# Patient Record
Sex: Female | Born: 1955 | Race: White | Hispanic: No | Smoking: Former smoker
Health system: Southern US, Community
[De-identification: ages and names within clinical notes are randomized; demographics above are authoritative.]

## PROBLEM LIST (undated history)

## (undated) DIAGNOSIS — T7840XA Allergy, unspecified, initial encounter: Secondary | ICD-10-CM

## (undated) DIAGNOSIS — F32A Depression, unspecified: Secondary | ICD-10-CM

## (undated) DIAGNOSIS — F329 Major depressive disorder, single episode, unspecified: Secondary | ICD-10-CM

## (undated) HISTORY — DX: Allergy, unspecified, initial encounter: T78.40XA

## (undated) HISTORY — DX: Depression, unspecified: F32.A

## (undated) HISTORY — PX: BREAST SURGERY: SHX581

## (undated) HISTORY — DX: Major depressive disorder, single episode, unspecified: F32.9

## (undated) HISTORY — PX: KNEE SURGERY: SHX244

## (undated) HISTORY — PX: BREAST EXCISIONAL BIOPSY: SUR124

## (undated) HISTORY — PX: COSMETIC SURGERY: SHX468

## (undated) NOTE — ED Provider Notes (Signed)
 Formatting of this note is different from the original. HISTORY   HISTORY LIMITATION  None  ARRIVAL MODE  Walk in clinic   CHIEF COMPLAINT  Otalgia (left)  HISTORY OF PRESENT ILLNESS  Patient is a 32 year old female with PMH of migraines presents to urgent care with complaints of left ear pain for the past week.  She has also had slight nasal congestion.  Otherwise denies other symptoms including cough, sore throat, fever/chills.  No ear drainage.  Has tried Tylenol  without improvement.  No history of frequent ear infections.  HEALTH STATUS   MEDICAL HISTORY  Problem List: There are no relevant problems documented for this patient.   SURGICAL HISTORY  History reviewed. No pertinent surgical history.  FAMILY HISTORY  Relevant family history has been mentioned in the HPI or was otherwise deemed not pertinent to today's reason for visit.  MEDICATIONS   Current Outpatient Medications  Medication Instructions   amoxicillin-clavulanate (AUGMENTIN) 875-125 mg per tablet 875 mg, Oral, 2 times daily   ALLERGIES  Patient has no known allergies.  SOCIAL HISTORY   Social History   Tobacco Use   Smoking status: Never   Smokeless tobacco: Never  Vaping Use   Vaping status: Never Used  Substance Use Topics   Alcohol use: Yes    Alcohol/week: 2.0 standard drinks of alcohol    Types: 2 Glasses of wine per week   Drug use: Never   EXAMINATION   VITAL SIGNS  Patient Vitals for the past 24 hrs:  BP Temp Temp src Pulse Resp SpO2  04/15/23 1707 (!) 140/99 98 F (36.7 C) Oral 89 18 100 %   PHYSICAL EXAM  Physical Exam Vitals and nursing note reviewed.  Constitutional:      General: She is not in acute distress.    Appearance: Normal appearance. She is not ill-appearing.  HENT:     Head: Normocephalic and atraumatic.     Right Ear: Tympanic membrane, ear canal and external ear normal.     Left Ear: Ear canal and external ear normal.     Ears:     Comments: Left ear with  suppurative effusion    Nose: Congestion present.     Mouth/Throat:     Mouth: Mucous membranes are moist.     Pharynx: Oropharynx is clear. No oropharyngeal exudate or posterior oropharyngeal erythema.  Eyes:     General:        Right eye: No discharge.        Left eye: No discharge.     Conjunctiva/sclera: Conjunctivae normal.  Cardiovascular:     Rate and Rhythm: Normal rate.  Pulmonary:     Effort: Pulmonary effort is normal.  Musculoskeletal:        General: Normal range of motion.     Cervical back: Normal range of motion.  Skin:    General: Skin is warm and dry.  Neurological:     General: No focal deficit present.     Mental Status: She is alert and oriented to person, place, and time.   DIAGNOSTICS AND PROCEDURES   LABS  No results found for this or any previous visit (from the past 24 hour(s)).  IMAGING   No orders to display   EKG AND PROCEDURES  Procedures  ADMINISTERED MEDICATIONS  Medications - No data to display  MEDICAL DECISION MAKING AND ED COURSE   MEDICAL DECISION MAKING  Patient is a 59 year old female who presents to urgent care with complaints of 1 week  of left-sided ear pain and nasal congestion.  On exam patient is alert, no acute distress, hemodynamically stable, afebrile.  She does have a left-sided otitis media.  Will start on Augmentin.  Advised other symptom cares, return precautions.  Patient understands and agrees with the plan.  She is discharged in stable condition.  EMERGENCY DEPARTMENT TIMELINE   Clinical Impressions as of 04/15/23 1755  Left otitis media, unspecified otitis media type     SOCIAL DETERMINATES OF HEALTH  The following factors affected the patient's Emergency Department care. None.  DIAGNOSTIC IMPRESSION   1. Left otitis media, unspecified otitis media type    DISPOSITION   DISCHARGED   NEW DISCHARGE MEDICATIONS   Discharge Medication List as of 04/15/2023  5:46 PM    START taking these medications    Details  amoxicillin-clavulanate (AUGMENTIN) 875-125 mg per tablet Take 1 tablet (875 mg total) by mouth 2 (two) times daily for 7 days., Starting Fri 04/15/2023, Until Fri 04/22/2023, Normal     AFTERCARE INSTRUCTIONS  No follow-up provider specified.  CELESTINO Willia Leek, PA-C  [facility] Office: [phone]     Fax: [phone]    In the interest of transparency, the 21st Century Cures Act requires healthcare organizations to make nearly all medical information readily available to patients.  Please remember that this document is intended as a form of ?peer-to-peer? communication.  Often medical records contain verbiage and abbreviations that might be unfamiliar and without context.  Language may appear direct as it is intended to succinctly relay the clinical opinion of the practitioner.    Cutts, Arianna, PA-C 04/15/23 1755   Chrystie Donnice RAMAN, MD 04/16/23 [phone]  Electronically signed by Chrystie Donnice RAMAN, MD at 04/16/2023  4:46 PM CST  Associated attestation - Chrystie Donnice RAMAN, MD - 04/16/2023  4:46 PM CST Formatting of this note might be different from the original. Definitive care was exclusively provided by the Advanced Practice Provider.

## (undated) NOTE — ED Triage Notes (Signed)
 Formatting of this note might be different from the original. Pt c/o left ear pain for approximately one week. Electronically signed by Laverna Skates, RN at 04/15/2023  5:17 PM CST

---

## 1997-06-18 ENCOUNTER — Ambulatory Visit (HOSPITAL_COMMUNITY): Admission: RE | Admit: 1997-06-18 | Discharge: 1997-06-18 | Payer: Self-pay | Admitting: *Deleted

## 1997-10-15 ENCOUNTER — Other Ambulatory Visit: Admission: RE | Admit: 1997-10-15 | Discharge: 1997-10-15 | Payer: Self-pay | Admitting: Obstetrics and Gynecology

## 1998-05-09 ENCOUNTER — Inpatient Hospital Stay (HOSPITAL_COMMUNITY): Admission: AD | Admit: 1998-05-09 | Discharge: 1998-05-09 | Payer: Self-pay | Admitting: Obstetrics and Gynecology

## 1998-05-10 ENCOUNTER — Inpatient Hospital Stay (HOSPITAL_COMMUNITY): Admission: AD | Admit: 1998-05-10 | Discharge: 1998-05-12 | Payer: Self-pay | Admitting: Obstetrics & Gynecology

## 1999-07-13 ENCOUNTER — Encounter: Payer: Self-pay | Admitting: Obstetrics and Gynecology

## 1999-07-13 ENCOUNTER — Encounter: Admission: RE | Admit: 1999-07-13 | Discharge: 1999-07-13 | Payer: Self-pay | Admitting: Obstetrics and Gynecology

## 1999-10-12 ENCOUNTER — Other Ambulatory Visit: Admission: RE | Admit: 1999-10-12 | Discharge: 1999-10-12 | Payer: Self-pay | Admitting: Obstetrics and Gynecology

## 2000-05-04 ENCOUNTER — Encounter: Payer: Self-pay | Admitting: Obstetrics and Gynecology

## 2000-05-04 ENCOUNTER — Encounter: Admission: RE | Admit: 2000-05-04 | Discharge: 2000-05-04 | Payer: Self-pay | Admitting: Obstetrics and Gynecology

## 2001-05-10 ENCOUNTER — Encounter: Payer: Self-pay | Admitting: Obstetrics and Gynecology

## 2001-05-10 ENCOUNTER — Encounter: Admission: RE | Admit: 2001-05-10 | Discharge: 2001-05-10 | Payer: Self-pay | Admitting: Obstetrics and Gynecology

## 2001-07-10 ENCOUNTER — Encounter: Payer: Self-pay | Admitting: Obstetrics and Gynecology

## 2001-07-10 ENCOUNTER — Encounter: Admission: RE | Admit: 2001-07-10 | Discharge: 2001-07-10 | Payer: Self-pay | Admitting: Obstetrics and Gynecology

## 2001-11-29 ENCOUNTER — Encounter: Admission: RE | Admit: 2001-11-29 | Discharge: 2001-11-29 | Payer: Self-pay | Admitting: Obstetrics and Gynecology

## 2001-11-29 ENCOUNTER — Encounter: Payer: Self-pay | Admitting: Obstetrics and Gynecology

## 2001-12-06 ENCOUNTER — Encounter (INDEPENDENT_AMBULATORY_CARE_PROVIDER_SITE_OTHER): Payer: Self-pay | Admitting: Specialist

## 2001-12-06 ENCOUNTER — Encounter: Payer: Self-pay | Admitting: Obstetrics and Gynecology

## 2001-12-06 ENCOUNTER — Encounter: Admission: RE | Admit: 2001-12-06 | Discharge: 2001-12-06 | Payer: Self-pay | Admitting: Obstetrics and Gynecology

## 2003-08-09 ENCOUNTER — Other Ambulatory Visit: Admission: RE | Admit: 2003-08-09 | Discharge: 2003-08-09 | Payer: Self-pay | Admitting: Obstetrics and Gynecology

## 2003-08-16 ENCOUNTER — Encounter: Admission: RE | Admit: 2003-08-16 | Discharge: 2003-08-16 | Payer: Self-pay | Admitting: Obstetrics and Gynecology

## 2005-01-26 ENCOUNTER — Encounter: Admission: RE | Admit: 2005-01-26 | Discharge: 2005-01-26 | Payer: Self-pay | Admitting: Obstetrics and Gynecology

## 2007-01-18 ENCOUNTER — Encounter: Admission: RE | Admit: 2007-01-18 | Discharge: 2007-01-18 | Payer: Self-pay | Admitting: Obstetrics and Gynecology

## 2008-08-23 ENCOUNTER — Encounter: Admission: RE | Admit: 2008-08-23 | Discharge: 2008-08-23 | Payer: Self-pay | Admitting: Obstetrics and Gynecology

## 2010-03-16 ENCOUNTER — Emergency Department (HOSPITAL_COMMUNITY)
Admission: EM | Admit: 2010-03-16 | Discharge: 2010-03-16 | Payer: Self-pay | Source: Home / Self Care | Admitting: Emergency Medicine

## 2010-03-23 ENCOUNTER — Observation Stay (HOSPITAL_COMMUNITY)
Admission: EM | Admit: 2010-03-23 | Discharge: 2010-03-24 | Payer: Self-pay | Source: Home / Self Care | Attending: Family Medicine | Admitting: Family Medicine

## 2010-03-23 DIAGNOSIS — F19939 Other psychoactive substance use, unspecified with withdrawal, unspecified: Secondary | ICD-10-CM

## 2010-03-23 DIAGNOSIS — F10239 Alcohol dependence with withdrawal, unspecified: Secondary | ICD-10-CM

## 2010-03-23 DIAGNOSIS — F19239 Other psychoactive substance dependence with withdrawal, unspecified: Secondary | ICD-10-CM

## 2010-03-23 DIAGNOSIS — F329 Major depressive disorder, single episode, unspecified: Secondary | ICD-10-CM

## 2010-03-30 LAB — COMPREHENSIVE METABOLIC PANEL
ALT: 13 U/L (ref 0–35)
AST: 17 U/L (ref 0–37)
Albumin: 4.4 g/dL (ref 3.5–5.2)
Alkaline Phosphatase: 42 U/L (ref 39–117)
BUN: 6 mg/dL (ref 6–23)
CO2: 21 mEq/L (ref 19–32)
Calcium: 9.4 mg/dL (ref 8.4–10.5)
Chloride: 103 mEq/L (ref 96–112)
Creatinine, Ser: 0.79 mg/dL (ref 0.4–1.2)
GFR calc Af Amer: >60 mL/min (ref >60)
GFR calc non Af Amer: >60 mL/min (ref >60)
Glucose, Bld: 133 mg/dL — ABNORMAL HIGH (ref 70–99)
Potassium: 2.7 mEq/L — CL (ref 3.5–5.1)
Sodium: 139 mEq/L (ref 135–145)
Total Bilirubin: 2.1 mg/dL — ABNORMAL HIGH (ref 0.3–1.2)
Total Protein: 6.9 g/dL (ref 6.0–8.3)

## 2010-03-30 LAB — CBC
HCT: 36.4 % (ref 36.0–46.0)
HCT: 32.9 % — ABNORMAL LOW (ref 36.0–46.0)
Hemoglobin: 12.1 g/dL (ref 12.0–15.0)
Hemoglobin: 10.7 g/dL — ABNORMAL LOW (ref 12.0–15.0)
MCH: 30.4 pg (ref 26.0–34.0)
MCH: 30.4 pg (ref 26.0–34.0)
MCHC: 33.2 g/dL (ref 30.0–36.0)
MCHC: 32.5 g/dL (ref 30.0–36.0)
MCV: 91.5 fL (ref 78.0–100.0)
MCV: 93.5 fL (ref 78.0–100.0)
Platelets: 238 10*3/uL (ref 150–400)
Platelets: 202 10*3/uL (ref 150–400)
RBC: 3.98 MIL/uL (ref 3.87–5.11)
RBC: 3.52 MIL/uL — ABNORMAL LOW (ref 3.87–5.11)
RDW: 13.5 % (ref 11.5–15.5)
RDW: 14.1 % (ref 11.5–15.5)
WBC: 4.7 10*3/uL (ref 4.0–10.5)
WBC: 3.5 10*3/uL — ABNORMAL LOW (ref 4.0–10.5)

## 2010-03-30 LAB — BASIC METABOLIC PANEL
BUN: 7 mg/dL (ref 6–23)
CO2: 25 mEq/L (ref 19–32)
Calcium: 8.5 mg/dL (ref 8.4–10.5)
Chloride: 108 mEq/L (ref 96–112)
Creatinine, Ser: 0.66 mg/dL (ref 0.4–1.2)
GFR calc Af Amer: >60 mL/min (ref >60)
GFR calc non Af Amer: >60 mL/min (ref >60)
Glucose, Bld: 92 mg/dL (ref 70–99)
Potassium: 3.7 mEq/L (ref 3.5–5.1)
Sodium: 141 mEq/L (ref 135–145)

## 2010-03-30 LAB — URINE MICROSCOPIC-ADD ON

## 2010-03-30 LAB — URINALYSIS, ROUTINE W REFLEX MICROSCOPIC
Bilirubin Urine: NEGATIVE
Hgb urine dipstick: NEGATIVE
Ketones, ur: >80 mg/dL — AB
Nitrite: NEGATIVE
Protein, ur: NEGATIVE mg/dL
Specific Gravity, Urine: 1.009 (ref 1.005–1.030)
Urine Glucose, Fasting: NEGATIVE mg/dL
Urobilinogen, UA: 0.2 mg/dL (ref 0.0–1.0)
pH: 6 (ref 5.0–8.0)

## 2010-03-30 LAB — URINE CULTURE
Colony Count: NO GROWTH
Culture  Setup Time: 201201091520
Culture: NO GROWTH

## 2010-03-30 LAB — DIFFERENTIAL
Basophils Absolute: 0 10*3/uL (ref 0.0–0.1)
Basophils Relative: 0 % (ref 0–1)
Eosinophils Absolute: 0 10*3/uL (ref 0.0–0.7)
Eosinophils Relative: 1 % (ref 0–5)
Lymphocytes Relative: 17 % (ref 12–46)
Lymphs Abs: 0.8 10*3/uL (ref 0.7–4.0)
Monocytes Absolute: 0.4 10*3/uL (ref 0.1–1.0)
Monocytes Relative: 8 % (ref 3–12)
Neutro Abs: 3.5 10*3/uL (ref 1.7–7.7)
Neutrophils Relative %: 74 % (ref 43–77)

## 2010-03-30 LAB — LIPASE, BLOOD: Lipase: 19 U/L (ref 11–59)

## 2010-04-03 NOTE — H&P (Signed)
NAME:  [name], [name]                  ACCOUNT NO.:  [id number]  MEDICAL RECORD NO.:  [id number]          PATIENT TYPE:  INP  LOCATION:  1825                         FACILITY:  [facility]  PHYSICIAN:  Pearlean Brownie, M.D.DATE OF BIRTH:  Feb 04, 1956  DATE OF ADMISSION:  03/23/2010 DATE OF DISCHARGE:                             HISTORY & PHYSICAL   PRIMARY CARE PHYSICIAN:  At [facility].  CHIEF COMPLAINT:  Nausea, vomiting, diarrhea.  HISTORY OF PRESENT ILLNESS:  [name] is a 55 year old female with a 2-week history of nausea, vomiting, diarrhea.  She states that she was first exposed to her son who had a viral illness x2 weeks ago at which time she developed the nausea, vomiting, and diarrhea.  She was seen at her primary care physician's office and prescribed Phenergan as well as Imodium.  She states that she started feeling little bit better and then started feeling worse again, so she went back to her doctor and was diagnosed with urinary tract infection.  At that time, she was prescribed Cipro.  She is back today complaining of more nausea, vomiting, diarrhea associated with headaches, dizziness, fatigue, body aches, and some slight tremors.  She denies fevers, chills, upper respiratory infection signs or symptoms, chest pain, shortness of breath, lower extremity edema and rash.  Upon further discussion, [name] also endorses taking usual amount of 0.5 to 2 mg of Xanax daily as well as drinking two glasses to one bottle of wine each night. She states that because of her illness she has not been able to take much by mouth and has not taking her benzodiazepine or alcohol in over 48 hours.  PAST MEDICAL HISTORY:  Includes depression, anxiety, insomnia, and alcohol abuse.  MEDICATIONS:  Include Zoloft 100 mg p.o. daily, Ambien 10 mg p.o. at bedtime, Xanax 0.5 to 1 mg p.o. daily p.r.n. anxiety, Mobic 50 mg p.o. q.8 h. p.r.n. pain though she admits that she has  not been taking this medication regularly.  ALLERGIES:  No known drug allergies.  PAST SURGICAL HISTORY:  She endorses previous septal surgery as well as bilateral laser knee surgeries.  FAMILY HISTORY:  Family history with no GI history.  REVIEW OF SYSTEMS:  Please see HPI.  PHYSICAL EXAMINATION:  VITALS:  Within normal limits with the exception of slightly elevated blood pressure of 140-160 systolic. GENERAL:  She is alert and oriented.  She does appear uncomfortable and is lying on her right side with the light off. HEENT: Normocephalic, atraumatic.  Pupils equally round and reactive to light.  Extraocular muscles are intact.  Mucous membranes are dry.  TMs are within normal limits. NECK:  No tenderness or masses on neck exam with full range of motion. LUNGS:  Clear to auscultation bilaterally. HEART:  Regular rate and rhythm.  No murmurs, rubs, or gallops. ABDOMEN:  Bowel sounds slightly hyperactive.  Abdomen is soft, nondistended.  She has generalized tenderness to palpation. EXTREMITIES:  No cyanosis, clubbing, or edema. SKIN:  No rashes.  LABORATORY DATA AND STUDIES:  Urine with over 80 ketones, small leukocyte esterase, 3-6 white.  CBC;  white blood cell count 4.7, hemoglobin 12.1, hematocrit 36.4, platelets 238.  Sodium 139, potassium 2.7, chloride 103, CO2 of 21, glucose 133, BUN 6, creatinine 0.79, total bilirubin 2.1, alk phos 42, AST 17, ALT 13, total protein 6.9, albumin 4.4, calcium 9.4, lipase is 19.  ASSESSMENT/PLAN:  [name] is a 55 year old female presenting with nausea, vomiting, diarrhea x2 weeks. 1. Nausea, vomiting, diarrhea.  This is a prolonged course, is likely     multifactorial at this point starting as viral versus UTI versus     influenza.  At this point, the patient has not been taking her     benzodiazepine or alcohol in over 48 hours, so I believe the     severity of her symptoms are likely secondary to withdrawal.  Her     vitals are  stable with the exception of slightly hypertensive     systolic blood pressure.  She endorses nausea, vomiting, diarrhea,     body aches, fatigue, and tremor, all of which could be explained at     this point by withdrawal as well as viral.  We will treat her     symptomatically providing antinausea medications as well as     rehydrate her with maintenance IV fluids and pushing oral fluids.     She is tolerating some oral intake, so we will start a bland diet     and progress room air. 2. Alcohol withdrawal, please see #1.  We will also restart her home     Xanax and put her on a CIWA protocol in case more needed at this     point.  We discussed complete detox of this patient.  She is unsure     of whether or not she wants to proceed at this point, will let us     know in morning. 3. Depression, anxiety.  Restart home medicines. 4. Insomnia.  This is likely worsened today secondary to #1.  We will     see how benzodiazepines control this issue and hold her Ambien for     now. 5. FEN, GI.  Maintenance IV fluid with normal saline at 125 mL per     hour, push oral fluids.  The patient was hyperkalemic in the     emergency department with potassium 2.7 and this has been replaced     with 10 mEq of IV potassium as well as 40 mEq p.o.  We will just     give another 40 in the morning and recheck her labs. 6. Prophylaxis.  Heparin subcu t.i.d. prophylactic dose. 7. Disposition.  Pending improvement and decision to continue with     full detox or not.     Helane Rima, MD   ______________________________ Pearlean Brownie, M.D.    EW/MEDQ  D:  03/23/2010  T:  03/23/2010  Job:  [id number]  Electronically Signed by Helane Rima MD on 03/31/2010 10:37:30 AM Electronically Signed by Pearlean Brownie M.D. on 04/03/2010 03:13:41 PM

## 2010-04-03 NOTE — Discharge Summary (Signed)
NAME:  [name] [name], [name] [name]                  ACCOUNT NO.:  [id number]  MEDICAL RECORD NO.:  [id number]          PATIENT TYPE:  INP  LOCATION:  5508                         FACILITY:  [facility]  PHYSICIAN:  Pearlean Brownie, M.D.DATE OF BIRTH:  10-16-1955  DATE OF ADMISSION:  03/23/2010 DATE OF DISCHARGE:  03/24/2010                              DISCHARGE SUMMARY   PRIMARY CARE PROVIDER:  The patient does not have a PCP; however, had been seen at [facility]gent [facility] at [facility].  DISCHARGE DIAGNOSES: 1. Alcohol dependence. 2. Benzodiazepine dependence. 3. Depression.  DISCHARGE MEDICATIONS:  Ondansetron 4 mg p.o. q.4 p.r.n. for nausea and Zoloft 100 mg p.o. daily.  DISCONTINUED MEDICATIONS: 1. Alprazolam 0.5 mg half to one tablet p.o. q.12 p.r.n. 2. Diphenoxylate and atropine 2 tablets p.o. q.6 p.r.n. 3. Zolpidem 10 mg half to one tablet p.o. at bedtime p.r.n. 4. Imodium 1 tablet p.o. q.4 p.r.n. 5. Ciprofloxacin 500 mg p.o. b.i.d.  PERTINENT LAB VALUES:  On March 23, 2010, CBC with differential was within normal limits.  Comprehensive metabolic panel; sodium 139, potassium 2.7, chloride 103, CO2 of 21, BUN 6, creatinine 0.79, and glucose 133.  Total bilirubin 2.1, alkaline phosphatase 42, AST 17, ALT 13, total protein 6.9, albumin 4.4, calcium 9.4.  Lipase 19.  Urinalysis had 80 ketones and small leukocytes.  Urine microscopic had 3-6 white blood cells per high-powered field.  On March 24, 2010, CBC; white blood cell count 3.5, hemoglobin 10.7, hematocrit 32.9, platelets 202. Basic metabolic panel; sodium 141, potassium 3.7, chloride 108, CO2 of 25, BUN 7, creatinine 0.66, glucose 92, calcium 8.5.  BRIEF HOSPITAL COURSE:  [name] [name] is a 55 year old lady who presented to the hospital with nausea, vomiting, and diarrhea concerning for a viral illness versus alcohol and benzodiazepine withdrawal. 1. Alcohol and benzodiazepine withdrawal.  The patient reported that     she had been having  nausea, vomiting, and diarrhea for several     weeks.  It had gotten better and then; however, she stopped     drinking wine regularly and taking her benzodiazepines and she     started feeling weak and shaky and nauseous again, so she came to     the hospital.  The [facility] Medicine Team were concerned that the     patient may be withdrawing.  She was placed on CIWA protocol and     monitored overnight and she only required one dose of Ativan in 24     hours. 2. Question of a viral infection.  The patient did not have an     elevated white blood cell count on admission.  She had no fevers     and her nausea and vomiting quickly subsided and the patient only     complained of loose stools on the day of discharge.  Family     medicine team feels that her symptoms are most likely due to early     withdrawals and not to gastroenteritis, 3. Depression.  The patient had been started on Zoloft prior to     admission; however, she had not taken the medicine  regularly.  The     family medicine team would recommend that she be encouraged to take     this medication on a regular basis and possibly titrate up if     needed.  FOLLOWUP ISSUES AND RECOMMENDATIONS:  The patient has been seen at [facility] several times before; however, has not established a primary care provider.  After discussing with the patient, she felt most comfortable following up with [place].  We recommend she establish herself with a primary care doctor there.  She will follow up for her depression and alcohol and benzodiazepine dependence.  The patient was discharged home in stable medical condition.    ______________________________ Ardyth Gal, MD   ______________________________ Pearlean Brownie, M.D.    CR/MEDQ  D:  03/24/2010  T:  03/25/2010  Job:  [id number]  Electronically Signed by Ardyth Gal MD on 03/27/2010 03:32:57 PM Electronically Signed by Pearlean Brownie  M.D. on 04/03/2010 03:13:57 PM

## 2010-05-16 ENCOUNTER — Emergency Department (HOSPITAL_COMMUNITY)
Admission: EM | Admit: 2010-05-16 | Discharge: 2010-05-17 | Disposition: A | Discharge status: Other Acute Inpatient Hospital | Payer: BC Managed Care – PPO | Attending: Emergency Medicine | Admitting: Emergency Medicine

## 2010-05-16 DIAGNOSIS — F3289 Other specified depressive episodes: Secondary | ICD-10-CM | POA: Insufficient documentation

## 2010-05-16 DIAGNOSIS — T50901A Poisoning by unspecified drugs, medicaments and biological substances, accidental (unintentional), initial encounter: Secondary | ICD-10-CM | POA: Insufficient documentation

## 2010-05-16 DIAGNOSIS — T50902A Poisoning by unspecified drugs, medicaments and biological substances, intentional self-harm, initial encounter: Secondary | ICD-10-CM | POA: Insufficient documentation

## 2010-05-16 DIAGNOSIS — F329 Major depressive disorder, single episode, unspecified: Secondary | ICD-10-CM | POA: Insufficient documentation

## 2010-05-16 LAB — DIFFERENTIAL
Basophils Absolute: 0 10*3/uL (ref 0.0–0.1)
Basophils Relative: 0 % (ref 0–1)
Eosinophils Absolute: 0 10*3/uL (ref 0.0–0.7)
Eosinophils Relative: 0 % (ref 0–5)
Lymphocytes Relative: 27 % (ref 12–46)
Lymphs Abs: 1.2 10*3/uL (ref 0.7–4.0)
Monocytes Absolute: 0.3 10*3/uL (ref 0.1–1.0)
Monocytes Relative: 7 % (ref 3–12)
Neutro Abs: 2.9 10*3/uL (ref 1.7–7.7)
Neutrophils Relative %: 65 % (ref 43–77)

## 2010-05-16 LAB — POCT I-STAT, CHEM 8
BUN: 8 mg/dL (ref 6–23)
Calcium, Ion: 1.13 mmol/L (ref 1.12–1.32)
Chloride: 105 mEq/L (ref 96–112)
Creatinine, Ser: 0.8 mg/dL (ref 0.4–1.2)
Glucose, Bld: 99 mg/dL (ref 70–99)
HCT: 41 % (ref 36.0–46.0)
Hemoglobin: 13.9 g/dL (ref 12.0–15.0)
Potassium: 3.5 mEq/L (ref 3.5–5.1)
Sodium: 141 mEq/L (ref 135–145)
TCO2: 24 mmol/L (ref 0–100)

## 2010-05-16 LAB — CBC
HCT: 41.3 % (ref 36.0–46.0)
Hemoglobin: 13.5 g/dL (ref 12.0–15.0)
MCH: 30.9 pg (ref 26.0–34.0)
MCHC: 32.7 g/dL (ref 30.0–36.0)
MCV: 94.5 fL (ref 78.0–100.0)
Platelets: 236 10*3/uL (ref 150–400)
RBC: 4.37 MIL/uL (ref 3.87–5.11)
RDW: 13.4 % (ref 11.5–15.5)
WBC: 4.4 10*3/uL (ref 4.0–10.5)

## 2010-05-16 LAB — RAPID URINE DRUG SCREEN, HOSP PERFORMED
Amphetamines: POSITIVE — AB
Barbiturates: NOT DETECTED
Benzodiazepines: NOT DETECTED
Cocaine: NOT DETECTED
Opiates: NOT DETECTED
Tetrahydrocannabinol: NOT DETECTED

## 2010-05-16 LAB — SALICYLATE LEVEL: Salicylate Lvl: <4 mg/dL (ref 2.8–20.0)

## 2010-05-16 LAB — ETHANOL: Alcohol, Ethyl (B): <5 mg/dL (ref 0–10)

## 2010-05-16 LAB — ACETAMINOPHEN LEVEL: Acetaminophen (Tylenol), Serum: <10 ug/mL — ABNORMAL LOW (ref 10–30)

## 2010-05-17 ENCOUNTER — Inpatient Hospital Stay (HOSPITAL_COMMUNITY)
Admission: AD | Admit: 2010-05-17 | Discharge: 2010-05-22 | DRG: 881 | Disposition: A | Discharge status: Home / Self Care | Payer: PRIVATE HEALTH INSURANCE | Source: Ambulatory Visit | Attending: Psychiatry | Admitting: Psychiatry

## 2010-05-17 DIAGNOSIS — Z818 Family history of other mental and behavioral disorders: Secondary | ICD-10-CM

## 2010-05-17 DIAGNOSIS — T43294A Poisoning by other antidepressants, undetermined, initial encounter: Secondary | ICD-10-CM

## 2010-05-17 DIAGNOSIS — IMO0002 Reserved for concepts with insufficient information to code with codable children: Secondary | ICD-10-CM

## 2010-05-17 DIAGNOSIS — T438X2A Poisoning by other psychotropic drugs, intentional self-harm, initial encounter: Secondary | ICD-10-CM

## 2010-05-17 DIAGNOSIS — T43205A Adverse effect of unspecified antidepressants, initial encounter: Secondary | ICD-10-CM

## 2010-05-17 DIAGNOSIS — F329 Major depressive disorder, single episode, unspecified: Principal | ICD-10-CM

## 2010-05-17 DIAGNOSIS — Z560 Unemployment, unspecified: Secondary | ICD-10-CM

## 2010-05-17 DIAGNOSIS — R4589 Other symptoms and signs involving emotional state: Secondary | ICD-10-CM

## 2010-05-17 DIAGNOSIS — T43502A Poisoning by unspecified antipsychotics and neuroleptics, intentional self-harm, initial encounter: Secondary | ICD-10-CM

## 2010-05-17 DIAGNOSIS — F3289 Other specified depressive episodes: Principal | ICD-10-CM

## 2010-05-18 DIAGNOSIS — F39 Unspecified mood [affective] disorder: Secondary | ICD-10-CM

## 2010-05-18 LAB — HEPATIC FUNCTION PANEL
ALT: 19 U/L (ref 0–35)
Bilirubin, Direct: 0.1 mg/dL (ref 0.0–0.3)
Indirect Bilirubin: 0.7 mg/dL (ref 0.3–0.9)
Total Bilirubin: 0.8 mg/dL (ref 0.3–1.2)
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
## 2010-05-19 LAB — FOLATE: Folate: >20 ng/mL

## 2010-05-19 LAB — VITAMIN B12: Vitamin B-12: 332 pg/mL (ref 211–911)

## 2010-05-24 NOTE — Discharge Summary (Signed)
NAME:  [name], [name]                  ACCOUNT NO.:  [id number]  MEDICAL RECORD NO.:  [id number]           PATIENT TYPE:  I  LOCATION:  0506                          FACILITY:  [facility]  PHYSICIAN:  Marlis Edelson, DO        DATE OF BIRTH:  [birth year]  DATE OF ADMISSION:  05/17/2010 DATE OF DISCHARGE:  05/22/2010                              DISCHARGE SUMMARY   CHIEF COMPLAINT:  Status post overdose.  HISTORY OF CHIEF COMPLAINT:  [name] [name] is a 55 year old Caucasian female who lives in the [place] area and presented to the [facility] Emergency Department status post overdose on trazodone.  The patient had reportedly taken 20 tablets of 100-mg trazodone.  She had left a suicidal note with the expectation that she would die.  She related that this was very unusual for her.  She had no history of suicidal ideation or attempts in the past and had no history of self- mutilation.  She has had depression which appears never to have reached a level of major depression.  This was her first and only suicidal attempt and she states that she had suffered from depression, anxiety and insomnia until things got worse following a viral infection in December.  Recent stressors included having lost her part-time job.  Her husband also lost his insurance.  She had been placed on Paxil and stated that she started to feel worse and worse and worse after taking the medication.  She develop suicidal ideation while on the Paxil.  She also began to feel restless.  She had been placed on trazodone for insomnia following the viral infection in December as well.  The viral infection had caused severe dehydration from diarrhea.  She was admitted to the hospital.  At that time the question whether she may have problems with benzodiazepines and alcohol.  PAST PSYCHIATRIC HISTORY:  Consistent with depression, NOS, with the criteria for major depressive disorder not being met.  She was followed by Dr. Mirian Mo in psychotherapy.  As stated above, she had no history of suicidal attempts, self-mutilation or previous hospitalizations.  MEDICATIONS ON ADMISSION:  Paxil 20 mg daily, trazodone 100 mg nightly.  She had reduced dose to 25 mg per night.  HOSPITAL COURSE:  Ms. Sundquist was placed on the adult unit where she was integrated into the adult milieu and group psychotherapy.  During the course of her hospitalization, she was pleasant, cooperative and engaging.  She did participate in groups.  There was no behavioral discord or issues during her hospitalization.  There was no witnessed self-harm, no attempts to harm others, no hypomania, no mania and no psychosis.  The Paxil had been discontinued due to its precipitating increased depression, suicidal ideation and restlessness.  This medication has been reported to do the same.  She has had problems with concentration, anhedonia and decreased libido.  We therefore looked at using Wellbutrin as an antidepressant.  Her brother suffers from depression and Wellbutrin had been beneficial for him, so we initiated Wellbutrin 150 mg SR during the course of her hospitalization.  In addition, a  vitamin B12 level showed a level less than 400 pg/mL and she was therefore started on oral supplementation for a period of time.  She tolerated the Wellbutrin without difficulty.  She felt depressed the immediate day following that but then began to improve.  By the 8th and 9th, she stated that she felt much better, her mood was good, she was exercising, she had no problems tolerating medications, her appetite was good, she was sleeping well with low-dose Vistaril, her motivation was up and she felt that the groups were really helping.  She stated I want to get back to my life.  Her suicidal ideation resolved.  She stated that she felt calmer and less anxious.  She was interactive and she was active and anticipating in groups.  She will be staying with her  mother after discharge for awhile which was felt to be appropriate for her safety plan.  She had resolved suicidal ideation.  She wants to be there for her children.  She reflected on the previous attempt and again in her mind she felt that it may have been precipitated by the medication because it has resolved in its absence and she had never had suicidal ideation before.  She states she is feeling very positive and looks forward to getting back to that state.  She had no psychosis and no homicidal ideation.  LABORATORY AND IMAGING:  TSH was normal at 1.117 uIU/mL, vitamin B12 332 pg/mL, folate greater than 20 ng/mL, hepatic function panel was normal. GGT was normal at 44.  PROCEDURES:  None.  COMPLICATIONS:  None.  CONSULTATIONS:  None.  MENTAL STATUS EXAM:  On the date of discharge, she was pleasant, cooperative, casually dressed, appropriately groomed.  Motor behavior was normal.  Speech normal.  Level of consciousness was alert.  Mood was "good."  Her affect is appropriate, much broader, anxiety level minimal and this centers around going back home.  Thought process linear, logical and goal-directed.  Thought content unremarkable for perceptual symptoms, ideas of reference, delusions or paranoia.  No suicidal or homicidal thought, intent or plan.  No mania or hypomania.  Judgment has improved.  Insight has improved.  She is cognitively intact.  DISCHARGE DIAGNOSES:  AXIS I:  Depressive disorder, not otherwise specified.  Suicidality felt to be secondary to medication side effect. AXIS II:  Deferred. AXIS III:  None. AXIS IV:  Financial stressors, loss of insurance, marital separation. AXIS V:  58.  DISCHARGE INSTRUCTIONS:  The patient is to follow up with the [facility].  In addition, she has followup with Dr. Rich Brave on Monday, May 25, 2010 at 7:00 p.m.  She also has followup scheduled with [facility] on May 26, 2010 at 3:40  p.m. She is to return to the hospital for any development of suicidal or homicidal ideation.  She is to return to hospital for any adverse reactions to medications.  She is also to avoid alcohol or other substances of abuse which was discussed with her.  DISCHARGE MEDICATIONS: 1. Vitamin B12 1000 mcg p.o. daily. 2. Vistaril 25 mg p.o. nightly p.r.n. 3. Wellbutrin 150 mg SR p.o. daily.  CONDITION ON DISCHARGE:  Improved with no active suicidal or homicidal ideation, no psychosis, no mania or hypomania.  PROGNOSIS:  Good with appropriate psychiatric follow-up, psychotherapy, medication compliance and management and the avoidance of alcohol.          ______________________________ Marlis Edelson, DO     DB/MEDQ  D:  05/22/2010  T:  05/22/2010  Job:  [id number]  Electronically Signed by Marlis Edelson MD on 05/24/2010 11:22:53 PM

## 2010-05-25 LAB — HEPATIC FUNCTION PANEL
ALT: 14 U/L (ref 0–35)
AST: 19 U/L (ref 0–37)
Albumin: 4.6 g/dL (ref 3.5–5.2)
Alkaline Phosphatase: 46 U/L (ref 39–117)
Total Bilirubin: 1.6 mg/dL — ABNORMAL HIGH (ref 0.3–1.2)
Total Protein: 7.6 g/dL (ref 6.0–8.3)

## 2010-05-25 LAB — DIFFERENTIAL
Basophils Absolute: 0 10*3/uL (ref 0.0–0.1)
Basophils Relative: 0 % (ref 0–1)
Eosinophils Absolute: 0 10*3/uL (ref 0.0–0.7)
Eosinophils Relative: 0 % (ref 0–5)
Monocytes Absolute: 0.3 10*3/uL (ref 0.1–1.0)
Monocytes Relative: 6 % (ref 3–12)
Neutro Abs: 3.4 10*3/uL (ref 1.7–7.7)

## 2010-05-25 LAB — BASIC METABOLIC PANEL
BUN: 6 mg/dL (ref 6–23)
CO2: 25 mEq/L (ref 19–32)
GFR calc non Af Amer: >60 mL/min (ref >60)
Glucose, Bld: 104 mg/dL — ABNORMAL HIGH (ref 70–99)
Potassium: 3.6 mEq/L (ref 3.5–5.1)

## 2010-05-25 LAB — URINALYSIS, ROUTINE W REFLEX MICROSCOPIC
Bilirubin Urine: NEGATIVE
Hgb urine dipstick: NEGATIVE
Ketones, ur: 40 mg/dL — AB
Nitrite: NEGATIVE
Protein, ur: NEGATIVE mg/dL
Specific Gravity, Urine: 1.007 (ref 1.005–1.030)
Urobilinogen, UA: 0.2 mg/dL (ref 0.0–1.0)

## 2010-05-25 LAB — CBC
HCT: 39.9 % (ref 36.0–46.0)
MCH: 31.5 pg (ref 26.0–34.0)
MCHC: 33.8 g/dL (ref 30.0–36.0)
RDW: 13.3 % (ref 11.5–15.5)

## 2010-05-25 LAB — LIPASE, BLOOD: Lipase: 16 U/L (ref 11–59)

## 2011-05-26 ENCOUNTER — Ambulatory Visit: Payer: BC Managed Care – PPO

## 2011-06-22 ENCOUNTER — Ambulatory Visit (INDEPENDENT_AMBULATORY_CARE_PROVIDER_SITE_OTHER): Payer: BC Managed Care – PPO | Admitting: Family Medicine

## 2011-06-22 VITALS — BP 107/70 | HR 75 | Temp 98.7°F | Resp 18 | Ht 64.5 in | Wt 133.0 lb

## 2011-06-22 DIAGNOSIS — Z79899 Other long term (current) drug therapy: Secondary | ICD-10-CM

## 2011-06-22 DIAGNOSIS — F32A Depression, unspecified: Secondary | ICD-10-CM

## 2011-06-22 DIAGNOSIS — F329 Major depressive disorder, single episode, unspecified: Secondary | ICD-10-CM

## 2011-06-22 DIAGNOSIS — Z719 Counseling, unspecified: Secondary | ICD-10-CM

## 2011-06-22 LAB — POCT CBC
Granulocyte percent: 48.5 %G (ref 37–80)
HCT, POC: 33.3 % — AB (ref 37.7–47.9)
Lymph, poc: 2.4 (ref 0.6–3.4)
MCHC: 32.7 g/dL (ref 31.8–35.4)
MID (cbc): 0.3 (ref 0–0.9)
POC Granulocyte: 2.6 (ref 2–6.9)
POC LYMPH PERCENT: 45.3 %L (ref 10–50)
Platelet Count, POC: 274 10*3/uL (ref 142–424)
RDW, POC: 13.8 %

## 2011-06-22 MED ORDER — NORTRIPTYLINE HCL 75 MG PO CAPS: 75.0000 mg | ORAL_CAPSULE | Freq: Every day | ORAL | Status: DC

## 2011-06-23 NOTE — Progress Notes (Signed)
  Subjective:    Patient ID: [name] [name], female    DOB: January 26, 1956, 56 y.o.   MRN: [id number]  HPI 56 yo CF here for having her Nortriptyline levels and an EKG.  No problems.  Denies palpitations. She has been on 75mg  and stable now for >54yr.  Review of Systems  All other systems reviewed and are negative.        Objective:   Physical Exam  Constitutional: She is oriented to person, place, and time. She appears well-developed and well-nourished.  HENT:  Head: Normocephalic and atraumatic.  Neck: Normal range of motion. Neck supple. No thyromegaly present.  Cardiovascular: Normal rate, regular rhythm, normal heart sounds and intact distal pulses.  Exam reveals no gallop and no friction rub.   No murmur heard. Pulmonary/Chest: Effort normal and breath sounds normal.  Neurological: She is alert and oriented to person, place, and time.  Skin: Skin is warm and dry.   Results for orders placed in visit on 06/22/11  POCT CBC      Component Value Range   WBC 5.4  4.6 - 10.2 (K/uL)   Lymph, poc 2.4  0.6 - 3.4    POC LYMPH PERCENT 45.3  10 - 50 (%L)   MID (cbc) 0.3  0 - 0.9    POC MID % 6.2  0 - 12 (%M)   POC Granulocyte 2.6  2 - 6.9    Granulocyte percent 48.5  37 - 80 (%G)   RBC 3.64 (*) 4.04 - 5.48 (M/uL)   Hemoglobin 10.9 (*) 12.2 - 16.2 (g/dL)   HCT, POC 78.2 (*) 95.6 - 47.9 (%)   MCV 91.4  80 - 97 (fL)   MCH, POC 29.9  27 - 31.2 (pg)   MCHC 32.7  31.8 - 35.4 (g/dL)   RDW, POC 21.3     Platelet Count, POC 274  142 - 424 (K/uL)   MPV 9.0  0 - 99.8 (fL)    EKG: normal EKG, normal sinus rhythm without Q-T prolongation read by Dr. Hal Hope.    Assessment & Plan:  High Risk Medications-Check Nortriptyline level.  EKG WNL.  Depression-stable on meds-Continue Nortriptyline. She was instructed  To get UTD on MMG and pap smear and agrees she will. Anemia-Will advise recheck in 6 weeks.

## 2012-06-01 ENCOUNTER — Other Ambulatory Visit: Payer: Self-pay | Admitting: Physician Assistant

## 2012-06-26 ENCOUNTER — Other Ambulatory Visit: Payer: Self-pay

## 2012-06-26 DIAGNOSIS — Z1231 Encounter for screening mammogram for malignant neoplasm of breast: Secondary | ICD-10-CM

## 2012-07-03 ENCOUNTER — Ambulatory Visit
Admission: RE | Admit: 2012-07-03 | Discharge: 2012-07-03 | Disposition: A | Discharge status: Home / Self Care | Payer: BC Managed Care – PPO | Source: Ambulatory Visit

## 2012-07-03 DIAGNOSIS — Z1231 Encounter for screening mammogram for malignant neoplasm of breast: Secondary | ICD-10-CM

## 2012-07-06 ENCOUNTER — Ambulatory Visit (INDEPENDENT_AMBULATORY_CARE_PROVIDER_SITE_OTHER): Payer: BC Managed Care – PPO | Admitting: Physician Assistant

## 2012-07-06 VITALS — BP 118/80 | HR 86 | Temp 98.5°F | Resp 16 | Ht 65.5 in | Wt 138.0 lb

## 2012-07-06 DIAGNOSIS — Z1211 Encounter for screening for malignant neoplasm of colon: Secondary | ICD-10-CM

## 2012-07-06 DIAGNOSIS — Z124 Encounter for screening for malignant neoplasm of cervix: Secondary | ICD-10-CM

## 2012-07-06 DIAGNOSIS — Z0000 Encounter for general adult medical examination without abnormal findings: Secondary | ICD-10-CM

## 2012-07-06 DIAGNOSIS — Z79899 Other long term (current) drug therapy: Secondary | ICD-10-CM

## 2012-07-06 LAB — COMPREHENSIVE METABOLIC PANEL
ALT: 20 U/L (ref 0–35)
Albumin: 4.5 g/dL (ref 3.5–5.2)
CO2: 27 mEq/L (ref 19–32)
Calcium: 9.7 mg/dL (ref 8.4–10.5)
Chloride: 100 mEq/L (ref 96–112)
Glucose, Bld: 105 mg/dL — ABNORMAL HIGH (ref 70–99)
Sodium: 136 mEq/L (ref 135–145)
Total Protein: 7.4 g/dL (ref 6.0–8.3)

## 2012-07-06 LAB — POCT CBC
HCT, POC: 40 % (ref 37.7–47.9)
Hemoglobin: 12.3 g/dL (ref 12.2–16.2)
Lymph, poc: 1.5 (ref 0.6–3.4)
MPV: 8.3 fL (ref 0–99.8)
POC Granulocyte: 2.2 (ref 2–6.9)
POC MID %: 8.2 %M (ref 0–12)
RBC: 4.26 M/uL (ref 4.04–5.48)
WBC: 4.1 10*3/uL — AB (ref 4.6–10.2)

## 2012-07-06 LAB — LIPID PANEL: Cholesterol: 234 mg/dL — ABNORMAL HIGH (ref 0–200)

## 2012-07-06 LAB — TSH: TSH: 1.084 u[IU]/mL (ref 0.350–4.500)

## 2012-07-06 NOTE — Patient Instructions (Addendum)
Continue taking the nortriptyline as directed.  I will send more refills to your pharmacy when your labs are back.  You should be getting a call about schedule your colonoscopy.  Let us know if any new concerns arise   Health Maintenance, Females A healthy lifestyle and preventative care can promote health and wellness.  Maintain regular health, dental, and eye exams.  Eat a healthy diet. Foods like vegetables, fruits, whole grains, low-fat dairy products, and lean protein foods contain the nutrients you need without too many calories. Decrease your intake of foods high in solid fats, added sugars, and salt. Get information about a proper diet from your caregiver, if necessary.  Regular physical exercise is one of the most important things you can do for your health. Most adults should get at least 150 minutes of moderate-intensity exercise (any activity that increases your heart rate and causes you to sweat) each week. In addition, most adults need muscle-strengthening exercises on 2 or more days a week.   Maintain a healthy weight. The body mass index (BMI) is a screening tool to identify possible weight problems. It provides an estimate of body fat based on height and weight. Your caregiver can help determine your BMI, and can help you achieve or maintain a healthy weight. For adults 20 years and older:  A BMI below 18.5 is considered underweight.  A BMI of 18.5 to 24.9 is normal.  A BMI of 25 to 29.9 is considered overweight.  A BMI of 30 and above is considered obese.  Maintain normal blood lipids and cholesterol by exercising and minimizing your intake of saturated fat. Eat a balanced diet with plenty of fruits and vegetables. Blood tests for lipids and cholesterol should begin at age 57 and be repeated every 5 years. If your lipid or cholesterol levels are high, you are over 50, or you are a high risk for heart disease, you may need your cholesterol levels checked more  frequently.Ongoing high lipid and cholesterol levels should be treated with medicines if diet and exercise are not effective.  If you smoke, find out from your caregiver how to quit. If you do not use tobacco, do not start.  If you are pregnant, do not drink alcohol. If you are breastfeeding, be very cautious about drinking alcohol. If you are not pregnant and choose to drink alcohol, do not exceed 1 drink per day. One drink is considered to be 12 ounces (355 mL) of beer, 5 ounces (148 mL) of wine, or 1.5 ounces (44 mL) of liquor.  Avoid use of street drugs. Do not share needles with anyone. Ask for help if you need support or instructions about stopping the use of drugs.  High blood pressure causes heart disease and increases the risk of stroke. Blood pressure should be checked at least every 1 to 2 years. Ongoing high blood pressure should be treated with medicines, if weight loss and exercise are not effective.  If you are 43 to 55 years old, ask your caregiver if you should take aspirin to prevent strokes.  Diabetes screening involves taking a blood sample to check your fasting blood sugar level. This should be done once every 3 years, after age 67, if you are within normal weight and without risk factors for diabetes. Testing should be considered at a younger age or be carried out more frequently if you are overweight and have at least 1 risk factor for diabetes.  Breast cancer screening is essential preventative care for women.  You should practice "breast self-awareness." This means understanding the normal appearance and feel of your breasts and may include breast self-examination. Any changes detected, no matter how small, should be reported to a caregiver. Women in their 42s and 30s should have a clinical breast exam (CBE) by a caregiver as part of a regular health exam every 1 to 3 years. After age 85, women should have a CBE every year. Starting at age 54, women should consider having a  mammogram (breast X-ray) every year. Women who have a family history of breast cancer should talk to their caregiver about genetic screening. Women at a high risk of breast cancer should talk to their caregiver about having an MRI and a mammogram every year.  The Pap test is a screening test for cervical cancer. Women should have a Pap test starting at age 48. Between ages 85 and 67, Pap tests should be repeated every 2 years. Beginning at age 15, you should have a Pap test every 3 years as long as the past 3 Pap tests have been normal. If you had a hysterectomy for a problem that was not cancer or a condition that could lead to cancer, then you no longer need Pap tests. If you are between ages 94 and 43, and you have had normal Pap tests going back 10 years, you no longer need Pap tests. If you have had past treatment for cervical cancer or a condition that could lead to cancer, you need Pap tests and screening for cancer for at least 20 years after your treatment. If Pap tests have been discontinued, risk factors (such as a new sexual partner) need to be reassessed to determine if screening should be resumed. Some women have medical problems that increase the chance of getting cervical cancer. In these cases, your caregiver may recommend more frequent screening and Pap tests.  The human papillomavirus (HPV) test is an additional test that may be used for cervical cancer screening. The HPV test looks for the virus that can cause the cell changes on the cervix. The cells collected during the Pap test can be tested for HPV. The HPV test could be used to screen women aged 14 years and older, and should be used in women of any age who have unclear Pap test results. After the age of 108, women should have HPV testing at the same frequency as a Pap test.  Colorectal cancer can be detected and often prevented. Most routine colorectal cancer screening begins at the age of 5 and continues through age 36. However, your  caregiver may recommend screening at an earlier age if you have risk factors for colon cancer. On a yearly basis, your caregiver may provide home test kits to check for hidden blood in the stool. Use of a small camera at the end of a tube, to directly examine the colon (sigmoidoscopy or colonoscopy), can detect the earliest forms of colorectal cancer. Talk to your caregiver about this at age 76, when routine screening begins. Direct examination of the colon should be repeated every 5 to 10 years through age 47, unless early forms of pre-cancerous polyps or small growths are found.  Hepatitis C blood testing is recommended for all people born from [birth year] through 1965 and any individual with known risks for hepatitis C.  Practice safe sex. Use condoms and avoid high-risk sexual practices to reduce the spread of sexually transmitted infections (STIs). Sexually active women aged 41 and younger should be checked for Chlamydia, which  is a common sexually transmitted infection. Older women with new or multiple partners should also be tested for Chlamydia. Testing for other STIs is recommended if you are sexually active and at increased risk.  Osteoporosis is a disease in which the bones lose minerals and strength with aging. This can result in serious bone fractures. The risk of osteoporosis can be identified using a bone density scan. Women ages 52 and over and women at risk for fractures or osteoporosis should discuss screening with their caregivers. Ask your caregiver whether you should be taking a calcium supplement or vitamin D to reduce the rate of osteoporosis.  Menopause can be associated with physical symptoms and risks. Hormone replacement therapy is available to decrease symptoms and risks. You should talk to your caregiver about whether hormone replacement therapy is right for you.  Use sunscreen with a sun protection factor (SPF) of 30 or greater. Apply sunscreen liberally and repeatedly throughout the  day. You should seek shade when your shadow is shorter than you. Protect yourself by wearing long sleeves, pants, a wide-brimmed hat, and sunglasses year round, whenever you are outdoors.  Notify your caregiver of new moles or changes in moles, especially if there is a change in shape or color. Also notify your caregiver if a mole is larger than the size of a pencil eraser.  Stay current with your immunizations. Document Released: 09/14/2010 Document Revised: 05/24/2011 Document Reviewed: 09/14/2010 [facility] Patient Information 2013 [place], [place].

## 2012-07-06 NOTE — Progress Notes (Signed)
Subjective:    Patient ID: [name] [name], female    DOB: 11-02-1955, 57 y.o.   MRN: [id number]  HPI   [name] [name] is a very pleasant 57 yr female here for CPE.  Was previously a pt of Dr. Wendee Copp.  Complaints: none LMP:  About 4 yrs ago, denies any postmenopausal bleeding. Contraception: none Pap/pelvic/breast/mammo: Last pap unknown, did have an abnormal in 41s; normal mammo this week; does SBE monthly Colonoscopy:  Never; would like ref today Dentist:  3 months ago, usually twice yearly Eye doctor:  Last year Immunizationo:  Thinks utd on all immunizations; per paper chart last tdap 2008 Not sexually active, "no interest" Diet:  varied Exercise:  Very active but does not work out specifically Meds:  nortriptyline Family history:  Mom and aunt with breast CA; grandfather DM; otherwise negative No smoking "I drink my fair of wine though" - admits to "at least" 1-2 glasses of wine per day.  Does not think that this is a problem.  Admits that alcohol use was possibly problematic in the past, but feels like much of that was due to untreated depression.  Pt has been on nortriptyline for about 2 yrs now.  75mg  qhs.  Was started by Dr. Lenise Arena in [facility] (?psychiatry).  States she feels great.  The medication controls her symptoms very well.  She has thought about tapering off of this because she feels so good.  But also a little afraid to stop the medication.  Denies adverse effects, palpitations.   Review of Systems  Constitutional: Negative.   HENT: Negative.   Respiratory: Negative.   Cardiovascular: Negative.   Gastrointestinal: Negative.   Genitourinary: Negative.   Musculoskeletal: Negative.   Skin: Negative.   Neurological: Negative.   Psychiatric/Behavioral: Negative.        Objective:   Physical Exam  Vitals reviewed. Constitutional: She is oriented to person, place, and time. She appears well-developed and well-nourished. No distress.  HENT:  Head: Normocephalic and  atraumatic.  Right Ear: Tympanic membrane and ear canal normal.  Left Ear: Tympanic membrane normal.  Mouth/Throat: Uvula is midline, oropharynx is clear and moist and mucous membranes are normal.  Eyes: Conjunctivae are normal. No scleral icterus.  Neck: Neck supple. No thyromegaly present.  Cardiovascular: Normal rate, regular rhythm, normal heart sounds and intact distal pulses.  Exam reveals no gallop and no friction rub.   No murmur heard. Pulmonary/Chest: Effort normal and breath sounds normal. She has no wheezes. She has no rales. Right breast exhibits no mass, no nipple discharge, no skin change and no tenderness. Left breast exhibits no mass, no nipple discharge, no skin change and no tenderness.  Abdominal: Soft. Bowel sounds are normal. She exhibits no distension and no mass. There is no tenderness. There is no rebound and no guarding.  Genitourinary: Vagina normal and uterus normal. There is no rash, tenderness or lesion on the right labia. There is no rash, tenderness or lesion on the left labia. Cervix exhibits no motion tenderness, no discharge and no friability. Right adnexum displays no mass, no tenderness and no fullness. Left adnexum displays no mass, no tenderness and no fullness.  Lymphadenopathy:    She has no cervical adenopathy.  Neurological: She is alert and oriented to person, place, and time. She has normal reflexes.  Skin: Skin is warm and dry.  Psychiatric: She has a normal mood and affect. Her behavior is normal.     Filed Vitals:   07/06/12 8295  BP: 118/80  Pulse: 86  Temp: 98.5 F (36.9 C)  Resp: 16     Results for orders placed in visit on 07/06/12  COMPREHENSIVE METABOLIC PANEL      Result Value Range   Sodium 136  135 - 145 mEq/L   Potassium 4.7  3.5 - 5.3 mEq/L   Chloride 100  96 - 112 mEq/L   CO2 27  19 - 32 mEq/L   Glucose, Bld 105 (*) 70 - 99 mg/dL   BUN 16  6 - 23 mg/dL   Creat 1.61  0.96 - 0.45 mg/dL   Total Bilirubin 0.7  0.3 - 1.2  mg/dL   Alkaline Phosphatase 50  39 - 117 U/L   AST 23  0 - 37 U/L   ALT 20  0 - 35 U/L   Total Protein 7.4  6.0 - 8.3 g/dL   Albumin 4.5  3.5 - 5.2 g/dL   Calcium 9.7  8.4 - 40.9 mg/dL  TSH      Result Value Range   TSH      LIPID PANEL      Result Value Range   Cholesterol 234 (*) 0 - 200 mg/dL   Triglycerides 77  <811 mg/dL   HDL 914  >78 mg/dL   Total CHOL/HDL Ratio 1.7     VLDL 15  0 - 40 mg/dL   LDL Cholesterol 85  0 - 99 mg/dL  POCT CBC      Result Value Range   WBC 4.1 (*) 4.6 - 10.2 K/uL   Lymph, poc 1.5  0.6 - 3.4   POC LYMPH PERCENT 37.0  10 - 50 %L   MID (cbc) 0.3  0 - 0.9   POC MID % 8.2  0 - 12 %M   POC Granulocyte 2.2  2 - 6.9   Granulocyte percent 54.8  37 - 80 %G   RBC 4.26  4.04 - 5.48 M/uL   Hemoglobin 12.3  12.2 - 16.2 g/dL   HCT, POC 29.5  62.1 - 47.9 %   MCV 93.8  80 - 97 fL   MCH, POC 28.9  27 - 31.2 pg   MCHC 30.8 (*) 31.8 - 35.4 g/dL   RDW, POC 30.8     Platelet Count, POC 320  142 - 424 K/uL   MPV 8.3  0 - 99.8 fL       Assessment & Plan:  Routine general medical examination at a health care facility - Plan: POCT CBC, Comprehensive metabolic panel, TSH, Lipid panel, Pap IG, CT/NG w/ reflex HPV when ASC-U, Ambulatory referral to Gastroenterology  --  [name] [name] is a very pleasant 57 yr old female here for CPE.  She appears to be in good health.  Will draw routine labs.  Discussed health maintenance.  Pt with normal mammogram this week.  Immunizations utd.    Screening for cervical cancer - Plan: Pap IG, CT/NG w/ reflex HPV when ASC-U  -- Pt unsure of last pap.  Knows that she had an abnormal sometime in her 77s.  Will collect pap today.  High risk medications (not anticoagulants) long-term use - Plan: Nortriptyline Level  --  Pt treated with nortriptyline for about 2 yrs now.  Feels very good on the medication.  Has considered going off the medication but is not sure if she wants to do this yet.  Discussed that pt will likely need a long taper as  she is on a high dose  and has been using the medication for some time now.  Discussed the possibility of symptom recurrence and encouraged her to have a strong support system should she decide to pursue this in the future.  Will check serum level today and refill when labs return.  Special screening for malignant neoplasms, colon - Plan: Ambulatory referral to Gastroenterology  --  Pt has never had screening colonoscopy.  Will place referral to GI.

## 2012-07-09 LAB — NORTRIPTYLINE LEVEL: Nortriptyline Lvl: 72 mcg/L (ref 50–150)

## 2012-07-10 LAB — PAP IG, CT-NG, RFX HPV ASCU

## 2012-07-11 MED ORDER — NORTRIPTYLINE HCL 75 MG PO CAPS: 75.0000 mg | ORAL_CAPSULE | Freq: Every day | ORAL | Status: DC

## 2012-07-11 NOTE — Addendum Note (Signed)
Addended by: Godfrey Pick on: 07/11/2012 07:39 AM   Modules accepted: Orders

## 2013-03-18 ENCOUNTER — Ambulatory Visit: Payer: BC Managed Care – PPO | Admitting: Internal Medicine

## 2013-03-18 VITALS — BP 110/76 | HR 101 | Temp 98.3°F | Resp 16 | Ht 65.5 in | Wt 134.0 lb

## 2013-03-18 DIAGNOSIS — R309 Painful micturition, unspecified: Secondary | ICD-10-CM

## 2013-03-18 DIAGNOSIS — N390 Urinary tract infection, site not specified: Secondary | ICD-10-CM

## 2013-03-18 LAB — POCT URINALYSIS DIPSTICK
Bilirubin, UA: NEGATIVE
Blood, UA: NEGATIVE
Glucose, UA: NEGATIVE
Ketones, UA: NEGATIVE
Leukocytes, UA: NEGATIVE
Nitrite, UA: POSITIVE
Protein, UA: NEGATIVE
Spec Grav, UA: 1.01
Urobilinogen, UA: 0.2
pH, UA: 6.5

## 2013-03-18 LAB — POCT UA - MICROSCOPIC ONLY
Bacteria, U Microscopic: NEGATIVE
Casts, Ur, LPF, POC: NEGATIVE
Crystals, Ur, HPF, POC: NEGATIVE
Mucus, UA: NEGATIVE
RBC, urine, microscopic: NEGATIVE
Yeast, UA: NEGATIVE

## 2013-03-18 MED ORDER — CIPROFLOXACIN HCL 500 MG PO TABS: 500.0000 mg | ORAL_TABLET | Freq: Two times a day (BID) | ORAL | Status: DC

## 2013-03-18 NOTE — Progress Notes (Signed)
Subjective:    Patient ID: [name] [name], female    DOB: [birth year], 57 y.o.   MRN: [id number]  HPI This chart was scribed for [facility], by Ladona Ridgel Day, Scribe. This patient was seen in room 5 and the patient's care was started at 11:53 AM.  HPI Comments: [name] [name] is a 58 y.o. female who presents to the [facility] complaining of constant, gradually worsening dysuria, urinary frequency and suprapubic abdominal pain, onset about 5 or 6 weeks ago; she reports last episode of UTI a few years ago. She reports took an at home UTI test which was positive. She took x3 urostat medicine for this problem w/some alleviation of her symptoms. She reports some mild lower back pain and intermittent fevers. She denies any vaginal d/c. She denies change for any STI. She states no changes to her menstrual cycle.   There are no active problems to display for this patient.  Past Surgical History  Procedure Laterality Date  . Breast surgery    . Cosmetic surgery    . Knee surgery Left     20 yrs ago    History reviewed. No pertinent family history.  History   Social History  . Marital Status: Married    Spouse Name: N/A    Number of Children: N/A  . Years of Education: N/A   Occupational History  . Not on file.   Social History Main Topics  . Smoking status: Former Games developer  . Smokeless tobacco: Not on file  . Alcohol Use: Not on file  . Drug Use: Not on file  . Sexual Activity: Not on file   Other Topics Concern  . Not on file   Social History Narrative  . No narrative on file   No Known Allergies  Results for orders placed in visit on 03/18/13  POCT UA - MICROSCOPIC ONLY      Result Value Range   WBC, Ur, HPF, POC 0-1     RBC, urine, microscopic neg     Bacteria, U Microscopic neg     Mucus, UA neg     Epithelial cells, urine per micros 0-2     Crystals, Ur, HPF, POC neg     Casts, Ur, LPF, POC neg     Yeast, UA neg    POCT URINALYSIS DIPSTICK        Result Value Range   Color, UA orange     Clarity, UA clear     Glucose, UA neg     Bilirubin, UA neg     Ketones, UA neg     Spec Grav, UA 1.010     Blood, UA neg     pH, UA 6.5     Protein, UA neg     Urobilinogen, UA 0.2     Nitrite, UA pos     Leukocytes, UA Negative     Review of Systems  Constitutional: Negative for chills.  HENT: Negative for congestion.   Gastrointestinal: Positive for abdominal pain (suprapubic abdominal discomfort). Negative for nausea and vomiting.  Genitourinary: Positive for dysuria and frequency. Negative for vaginal discharge and menstrual problem.  Musculoskeletal: Back pain: mild bilateral lower back pain.      Objective:   Physical Exam  Nursing note and vitals reviewed. Constitutional: She is oriented to person, place, and time. She appears well-developed and well-nourished. No distress.  HENT:  Head: Normocephalic and atraumatic.  Eyes: Conjunctivae are normal. Right eye exhibits  no discharge. Left eye exhibits no discharge.  Neck: Normal range of motion.  Cardiovascular: Normal rate.   Pulmonary/Chest: Effort normal. No respiratory distress.  Abdominal: Soft. She exhibits no distension. There is tenderness.  Minimal suprapubic tenderness  Musculoskeletal: Normal range of motion. She exhibits no edema and no tenderness.  No CVA tenderness  Neurological: She is alert and oriented to person, place, and time.  Skin: Skin is warm and dry.  Psychiatric: She has a normal mood and affect. Thought content normal.   Triage Vitals: BP 110/76  Pulse 101  Temp(Src) 98.3 F (36.8 C) (Oral)  Resp 16  Ht 5' 5.5" (1.664 m)  Wt 134 lb (60.782 kg)  BMI 21.95 kg/m2  SpO2 99%  DIAGNOSTIC STUDIES: Oxygen Saturation is 99% on room air, normal by my interpretation.    COORDINATION OF CARE: At 1150 AM Discussed treatment plan with patient which includes antibiotics. Patient agrees.      Assessment & Plan:  I have completed the patient  encounter in its entirety as documented by the scribe, with editing by me where necessary. [name] [name], M.D.  Urination pain - Plan: POCT UA - Microscopic Only, POCT urinalysis dipstick  Urinary tract infection, site not specified  Meds ordered this encounter  Medications  . ciprofloxacin (CIPRO) 500 MG tablet    Sig: Take 1 tablet (500 mg total) by mouth 2 (two) times daily.    Dispense:  20 tablet    Refill:  0

## 2013-05-12 ENCOUNTER — Other Ambulatory Visit: Payer: Self-pay | Admitting: Physician Assistant

## 2013-08-09 ENCOUNTER — Other Ambulatory Visit: Payer: Self-pay | Admitting: Physician Assistant

## 2013-08-14 ENCOUNTER — Other Ambulatory Visit: Payer: Self-pay | Admitting: Physician Assistant

## 2013-08-15 ENCOUNTER — Telehealth: Payer: Self-pay

## 2013-08-15 NOTE — Telephone Encounter (Signed)
Patient is requesting a 30 day refill -  she is aware she needs an OV - BUT she is in the middle of a move.   nortriptyline (PAMELOR) 75 MG capsule   [facility] - [address]   [phone]

## 2013-08-16 MED ORDER — NORTRIPTYLINE HCL 75 MG PO CAPS: ORAL_CAPSULE | ORAL | Status: DC

## 2013-08-16 NOTE — Telephone Encounter (Signed)
Meds ordered this encounter  Medications  . nortriptyline (PAMELOR) 75 MG capsule    Sig: TAKE ONE CAPSULE BY MOUTH AT BEDTIME. Need office visit for additional refills.    Dispense:  90 capsule    Refill:  0    Needs OV    Order Specific Question:  Supervising Provider    Answer:  DOOLITTLE, ROBERT P [[id number]]

## 2013-08-17 NOTE — Telephone Encounter (Signed)
LM rx is at pharmacy.

## 2013-11-15 ENCOUNTER — Other Ambulatory Visit: Payer: Self-pay | Admitting: Physician Assistant

## 2014-01-19 ENCOUNTER — Ambulatory Visit (INDEPENDENT_AMBULATORY_CARE_PROVIDER_SITE_OTHER): Payer: BC Managed Care – PPO | Admitting: Internal Medicine

## 2014-01-19 VITALS — BP 110/74 | HR 94 | Temp 98.4°F | Resp 16 | Ht 65.0 in | Wt 130.6 lb

## 2014-01-19 DIAGNOSIS — G43909 Migraine, unspecified, not intractable, without status migrainosus: Secondary | ICD-10-CM

## 2014-01-19 DIAGNOSIS — F39 Unspecified mood [affective] disorder: Secondary | ICD-10-CM

## 2014-01-19 DIAGNOSIS — Z7189 Other specified counseling: Secondary | ICD-10-CM

## 2014-01-19 MED ORDER — NORTRIPTYLINE HCL 25 MG PO CAPS
25.0000 mg | ORAL_CAPSULE | Freq: Every day | ORAL | Status: DC
Start: 2014-01-19 — End: 2014-11-07

## 2014-01-19 MED ORDER — NORTRIPTYLINE HCL 50 MG PO CAPS: 50.0000 mg | ORAL_CAPSULE | Freq: Every day | ORAL | Status: DC

## 2014-01-19 NOTE — Progress Notes (Signed)
   Subjective:    Patient ID: [name] [name], female    DOB: 1955/09/16, [age] y.o.   MRN: [id number]  HPI Patient here to get refills of pamelor 75 mg.  Patient states that it helps prevent migraines and also works as antidepressant for her.  Patient would like to try to reduce dose.  Patient requesting pamelor 50mg  and a 25 mg RX.  She is doing well. CPE here with [name] PAc all normal less than 1 year.  Review of Systems     Objective:   Physical Exam  Constitutional: She is oriented to person, place, and time. She appears well-developed and well-nourished. No distress.  HENT:  Head: Normocephalic.  Mouth/Throat: Oropharynx is clear and moist.  Eyes: EOM are normal.  Neck: Normal range of motion.  Cardiovascular: Normal rate, regular rhythm and normal heart sounds.   Pulmonary/Chest: Effort normal and breath sounds normal.  Neurological: She is alert and oriented to person, place, and time.  Psychiatric: She has a normal mood and affect. Her behavior is normal. Judgment and thought content normal.          Assessment & Plan:  Migraine and mood disorder Nortriptyline 50mg  90 3rfs Nortiptyline 25mg  90 3rfs

## 2014-01-19 NOTE — Patient Instructions (Signed)

## 2014-11-07 ENCOUNTER — Other Ambulatory Visit: Payer: Self-pay | Admitting: Internal Medicine

## 2014-12-06 ENCOUNTER — Other Ambulatory Visit: Payer: Self-pay | Admitting: Internal Medicine

## 2014-12-10 ENCOUNTER — Other Ambulatory Visit: Payer: Self-pay

## 2014-12-10 DIAGNOSIS — Z1231 Encounter for screening mammogram for malignant neoplasm of breast: Secondary | ICD-10-CM

## 2014-12-16 ENCOUNTER — Ambulatory Visit (INDEPENDENT_AMBULATORY_CARE_PROVIDER_SITE_OTHER): Payer: BLUE CROSS/BLUE SHIELD | Admitting: Emergency Medicine

## 2014-12-16 VITALS — BP 114/68 | HR 92 | Temp 98.9°F | Resp 20 | Ht 65.0 in | Wt 135.4 lb

## 2014-12-16 DIAGNOSIS — G43109 Migraine with aura, not intractable, without status migrainosus: Secondary | ICD-10-CM | POA: Diagnosis not present

## 2014-12-16 DIAGNOSIS — G43909 Migraine, unspecified, not intractable, without status migrainosus: Secondary | ICD-10-CM | POA: Insufficient documentation

## 2014-12-16 MED ORDER — NORTRIPTYLINE HCL 75 MG PO CAPS: 75.0000 mg | ORAL_CAPSULE | Freq: Every day | ORAL | Status: DC

## 2014-12-16 NOTE — Progress Notes (Signed)
Subjective:  Patient ID: [name] [name], female    DOB: 1955/04/21  Age: 59 y.o. MRN: [id number]  CC: Medication Refill and Immunizations   HPI [name] [name] presents  patients come for refill on her Pamelor which she takes for mood and migraines. She hasn't had a migraine headache in quite some time to remember the last time she was treated for migraine and had at one point attempted to wean herself down in dose and that was unsuccessful. She's tolerating medication well with no adverse side effects  History [name] [name] has a past medical history of Allergy.   She has past surgical history that includes Breast surgery; Cosmetic surgery; and Knee surgery (Left).   Her  family history is not on file.  She   reports that she has quit smoking. She does not have any smokeless tobacco history on file. Her alcohol and drug histories are not on file.  Outpatient Prescriptions Prior to Visit  Medication Sig Dispense Refill  . nortriptyline (PAMELOR) 75 MG capsule Take 1 capsule (75 mg total) by mouth at bedtime. NO MORE REFILLS WITHOUT OFFICE VISIT - 2ND NOTICE (Patient taking differently: Take 75 mg by mouth at bedtime. ) 15 capsule 0  . nortriptyline (PAMELOR) 25 MG capsule Take 1 capsule (25 mg total) by mouth at bedtime. PATIENT NEEDS OFFICE VISIT FOR ADDITIONAL REFILLS (Patient not taking: Reported on 12/16/2014) 90 capsule 0  . nortriptyline (PAMELOR) 50 MG capsule Take 1 capsule (50 mg total) by mouth at bedtime. (Patient not taking: Reported on 12/16/2014) 90 capsule 3   No facility-administered medications prior to visit.    Social History   Social History  . Marital Status: Married    Spouse Name: N/A  . Number of Children: N/A  . Years of Education: N/A   Social History Main Topics  . Smoking status: Former Games developer  . Smokeless tobacco: Not on file  . Alcohol Use: Not on file  . Drug Use: Not on file  . Sexual Activity: Not on file   Other Topics Concern  . Not on file    Social History Narrative     Review of Systems  Constitutional: Negative for fever, chills and appetite change.  HENT: Negative for congestion, ear pain, postnasal drip, sinus pressure and sore throat.   Eyes: Negative for pain and redness.  Respiratory: Negative for cough, shortness of breath and wheezing.   Cardiovascular: Negative for leg swelling.  Gastrointestinal: Negative for nausea, vomiting, abdominal pain, diarrhea, constipation and blood in stool.  Endocrine: Negative for polyuria.  Genitourinary: Negative for dysuria, urgency, frequency and flank pain.  Musculoskeletal: Negative for gait problem.  Skin: Negative for rash.  Neurological: Negative for weakness and headaches.  Psychiatric/Behavioral: Negative for confusion and decreased concentration. The patient is not nervous/anxious.     Objective:  BP 114/68 mmHg  Pulse 92  Temp(Src) 98.9 F (37.2 C) (Oral)  Resp 20  Ht  (1.651 m)  Wt 135 lb 6 oz (61.406 kg)  BMI 22.53 kg/m2  SpO2 98%  Physical Exam  Constitutional: She is oriented to person, place, and time. She appears well-developed and well-nourished.  HENT:  Head: Normocephalic and atraumatic.  Eyes: Conjunctivae are normal. Pupils are equal, round, and reactive to light.  Pulmonary/Chest: Effort normal.  Musculoskeletal: She exhibits no edema.  Neurological: She is alert and oriented to person, place, and time.  Skin: Skin is dry.  Psychiatric: She has a normal mood and affect. Her behavior is  normal. Thought content normal.      Assessment & Plan:   [name] [name] was seen today for medication refill and immunizations.  Diagnoses and all orders for this visit:  Migraine with aura and without status migrainosus, not intractable  Other orders -     nortriptyline (PAMELOR) 75 MG capsule; Take 1 capsule (75 mg total) by mouth at bedtime.  I have discontinued Ms. Palomarez's nortriptyline and nortriptyline. I have also changed her  nortriptyline.  Meds ordered this encounter  Medications  . nortriptyline (PAMELOR) 75 MG capsule    Sig: Take 1 capsule (75 mg total) by mouth at bedtime.    Dispense:  90 capsule    Refill:  3    Appropriate red flag conditions were discussed with the patient as well as actions that should be taken.  Patient expressed his understanding.  Follow-up: Return if symptoms worsen or fail to improve.  Carmelina Dane, MD

## 2014-12-16 NOTE — Patient Instructions (Signed)

## 2014-12-19 ENCOUNTER — Ambulatory Visit
Admission: RE | Admit: 2014-12-19 | Discharge: 2014-12-19 | Disposition: A | Discharge status: Home / Self Care | Payer: BLUE CROSS/BLUE SHIELD | Source: Ambulatory Visit

## 2014-12-19 DIAGNOSIS — Z1231 Encounter for screening mammogram for malignant neoplasm of breast: Secondary | ICD-10-CM

## 2015-03-06 ENCOUNTER — Ambulatory Visit (INDEPENDENT_AMBULATORY_CARE_PROVIDER_SITE_OTHER): Payer: Self-pay | Admitting: Physician Assistant

## 2015-03-06 ENCOUNTER — Encounter: Payer: Self-pay | Admitting: Physician Assistant

## 2015-03-06 VITALS — BP 118/76 | HR 115 | Temp 98.1°F | Resp 16 | Ht 66.0 in | Wt 136.0 lb

## 2015-03-06 DIAGNOSIS — R112 Nausea with vomiting, unspecified: Secondary | ICD-10-CM

## 2015-03-06 DIAGNOSIS — R300 Dysuria: Secondary | ICD-10-CM

## 2015-03-06 DIAGNOSIS — N3090 Cystitis, unspecified without hematuria: Secondary | ICD-10-CM

## 2015-03-06 LAB — POCT URINALYSIS DIP (MANUAL ENTRY)
Glucose, UA: NEGATIVE
Ketones, POC UA: NEGATIVE
NITRITE UA: NEGATIVE
PH UA: 6
Protein Ur, POC: 100 — AB
Spec Grav, UA: >=1.03
Urobilinogen, UA: 2

## 2015-03-06 LAB — POC MICROSCOPIC URINALYSIS (UMFC)

## 2015-03-06 MED ORDER — ONDANSETRON 4 MG PO TBDP
4.0000 mg | ORAL_TABLET | Freq: Once | ORAL | Status: AC
  Administered 2015-03-06: 4 mg via ORAL

## 2015-03-06 MED ORDER — CIPROFLOXACIN HCL 500 MG PO TABS: 500.0000 mg | ORAL_TABLET | Freq: Two times a day (BID) | ORAL | Status: AC

## 2015-03-06 MED ORDER — ONDANSETRON 8 MG PO TBDP: 8.0000 mg | ORAL_TABLET | Freq: Three times a day (TID) | ORAL | Status: DC | PRN

## 2015-03-06 NOTE — Addendum Note (Signed)
Addended by: Trena PlattENGLISH, STEPHANIE D on: 03/06/2015 09:14 PM   Modules accepted: Kipp BroodSmartSet

## 2015-03-06 NOTE — Progress Notes (Signed)
[facility] [address], [place] [id number] [phone]  Date:  03/06/2015   Name:  [name] DoppLaura L [name]   DOB:  1955-10-01   MRN:  [id number]  PCP:  No primary care provider on file.    History of Present Illness:  [name] [name] is a 59 y.o. female patient who [facility] cc of dysuria for 3-4 days.  There is malodorous urine.  She had tried to drink cranberry juice and water.  However she has had considerable nausea and emesis.  Subjective fever.  Took motrin 2-3 hours ago.  No abdominal pain.  minimal back pain.  She is not sexually active.     Patient Active Problem List   Diagnosis Date Noted  . Migraine 12/16/2014    Past Medical History  Diagnosis Date  . Allergy     Past Surgical History  Procedure Laterality Date  . Breast surgery    . Cosmetic surgery    . Knee surgery Left     20 yrs ago    Social History  Substance Use Topics  . Smoking status: Former Games developermoker  . Smokeless tobacco: Not on file  . Alcohol Use: Not on file    No family history on file.  No Known Allergies  Medication list has been reviewed and updated.  Current Outpatient Prescriptions on File Prior to Visit  Medication Sig Dispense Refill  . nortriptyline (PAMELOR) 75 MG capsule Take 1 capsule (75 mg total) by mouth at bedtime. 90 capsule 3   No current facility-administered medications on file prior to visit.    ROS ROS otherwise unremarkable unless listed above.   Physical Examination: There were no vitals taken for this visit. Ideal Body Weight:    Physical Exam  Constitutional: She is oriented to person, place, and time. She appears well-developed and well-nourished. No distress.  HENT:  Head: Normocephalic and atraumatic.  Right Ear: External ear normal.  Left Ear: External ear normal.  Eyes: Conjunctivae and EOM are normal. Pupils are equal, round, and reactive to light.  Cardiovascular: Normal rate.   Pulmonary/Chest: Effort normal. No  respiratory distress.  Abdominal: Soft. Normal appearance and bowel sounds are normal. There is no hepatosplenomegaly. There is tenderness in the suprapubic area. There is no CVA tenderness, no tenderness at McBurney's point and negative Murphy's sign.  Neurological: She is alert and oriented to person, place, and time.  Skin: She is not diaphoretic.  Psychiatric: She has a normal mood and affect. Her behavior is normal.   Results for orders placed or performed in visit on 03/06/15  POCT urinalysis dipstick  Result Value Ref Range   Color, UA yellow yellow   Clarity, UA cloudy (A) clear   Glucose, UA negative negative   Bilirubin, UA moderate (A) negative   Ketones, POC UA negative negative   Spec Grav, UA >=1.030    Blood, UA small (A) negative   pH, UA 6.0    Protein Ur, POC =100 (A) negative   Urobilinogen, UA 2.0    Nitrite, UA Negative Negative   Leukocytes, UA small (1+) (A) Negative  POCT Microscopic Urinalysis ([facility])  Result Value Ref Range   WBC,UR,HPF,POC Too numerous to count  (A) None WBC/hpf   RBC,UR,HPF,POC Few (A) None RBC/hpf   Bacteria Moderate (A) None, Too numerous to count   Mucus Present (A) Absent   Epithelial Cells, UR Per Microscopy Moderate (A) None, Too numerous to count cells/hpf  Assessment and Plan: [name] [name] is a 59 y.o. female who is here today for dysuria, nausea, and emesis. -patient is refusing a urine culture at this time secondary to cost.  -possibility that this is pyelonephritis and we will treat at this time for that.  Given zofran.  Advised that she return if her symptoms do not improve, not tolerating medication, fluids, or food.  Patient voiced understanding.   Cystitis - Plan: ondansetron (ZOFRAN-ODT) 8 MG disintegrating tablet  Dysuria - Plan: POCT urinalysis dipstick, POCT Microscopic Urinalysis ([facility]), ondansetron (ZOFRAN-ODT) disintegrating tablet 4 mg, ciprofloxacin (CIPRO) 500 MG tablet  Nausea and vomiting, intractability  of vomiting not specified, unspecified vomiting type - Plan: ondansetron (ZOFRAN-ODT) disintegrating tablet 4 mg, ciprofloxacin (CIPRO) 500 MG tablet   [name] Platt, PA-C [facility] 03/06/2015 5:56 PM

## 2015-03-06 NOTE — Patient Instructions (Addendum)
Please hydrate well. If you have any issues taking in water, food, or tolerating the medication, fever--you need to let us know immediately, and return.

## 2015-08-27 ENCOUNTER — Other Ambulatory Visit: Payer: Self-pay | Admitting: Internal Medicine

## 2015-08-27 DIAGNOSIS — Z1231 Encounter for screening mammogram for malignant neoplasm of breast: Secondary | ICD-10-CM

## 2015-09-23 ENCOUNTER — Telehealth: Payer: Self-pay

## 2015-09-23 NOTE — Telephone Encounter (Signed)
nortriptyline (PAMELOR) 75 MG capsule   Patient states [facility] [address] [facility] repsonse [facility].   She is out of her medication   [facility] [address]  [phone]

## 2015-09-24 MED ORDER — NORTRIPTYLINE HCL 75 MG PO CAPS: 75.0000 mg | ORAL_CAPSULE | Freq: Every day | ORAL | Status: DC

## 2015-09-24 NOTE — Telephone Encounter (Signed)
We do not have any requests from the pharm in [facility], but pharm should still have a 90 day RF left from last Oct's Rx. I had Laqueta DueKaya advise pt that I will re-send that RF, but that she is overdue for f/up so she will need to be seen before any add'l refills.

## 2015-12-23 ENCOUNTER — Ambulatory Visit: Payer: BLUE CROSS/BLUE SHIELD

## 2016-01-01 ENCOUNTER — Ambulatory Visit
Admission: RE | Admit: 2016-01-01 | Discharge: 2016-01-01 | Disposition: A | Discharge status: Home / Self Care | Payer: BLUE CROSS/BLUE SHIELD | Source: Ambulatory Visit | Attending: Internal Medicine | Admitting: Internal Medicine

## 2016-01-01 DIAGNOSIS — Z1231 Encounter for screening mammogram for malignant neoplasm of breast: Secondary | ICD-10-CM

## 2016-01-10 ENCOUNTER — Other Ambulatory Visit: Payer: Self-pay | Admitting: Urgent Care

## 2016-01-10 ENCOUNTER — Other Ambulatory Visit: Payer: Self-pay | Admitting: *Deleted

## 2016-01-10 ENCOUNTER — Telehealth: Payer: Self-pay

## 2016-01-10 MED ORDER — NORTRIPTYLINE HCL 75 MG PO CAPS: 75.0000 mg | ORAL_CAPSULE | Freq: Every day | ORAL | 0 refills | Status: DC

## 2016-01-10 NOTE — Telephone Encounter (Signed)
Patient needs her nortriptyline (PAMELOR) 75 MG capsule refilled she came in as a walkin on Saturday but we were completely booked so I told her that we could put in a message and make her an appointment for Monday which she did.  [facility] [address]

## 2016-01-12 ENCOUNTER — Ambulatory Visit: Payer: Self-pay

## 2016-01-12 NOTE — Telephone Encounter (Signed)
Rx was sent in. 

## 2016-01-23 ENCOUNTER — Ambulatory Visit (INDEPENDENT_AMBULATORY_CARE_PROVIDER_SITE_OTHER): Payer: BLUE CROSS/BLUE SHIELD | Admitting: Family Medicine

## 2016-01-23 VITALS — BP 122/70 | HR 78 | Temp 98.6°F | Resp 16 | Ht 66.0 in | Wt 133.0 lb

## 2016-01-23 DIAGNOSIS — Z13220 Encounter for screening for lipoid disorders: Secondary | ICD-10-CM

## 2016-01-23 DIAGNOSIS — Z23 Encounter for immunization: Secondary | ICD-10-CM

## 2016-01-23 DIAGNOSIS — Z131 Encounter for screening for diabetes mellitus: Secondary | ICD-10-CM | POA: Diagnosis not present

## 2016-01-23 DIAGNOSIS — Z1329 Encounter for screening for other suspected endocrine disorder: Secondary | ICD-10-CM | POA: Diagnosis not present

## 2016-01-23 DIAGNOSIS — Z0000 Encounter for general adult medical examination without abnormal findings: Secondary | ICD-10-CM

## 2016-01-23 MED ORDER — NORTRIPTYLINE HCL 75 MG PO CAPS: 75.0000 mg | ORAL_CAPSULE | Freq: Every day | ORAL | 3 refills | Status: DC

## 2016-01-23 NOTE — Progress Notes (Signed)
Patient ID: [name] [name], female    DOB: 1955/11/30, 60 y.o.   MRN: [id number]  PCP: No primary care provider on file.  Chief Complaint  Patient presents with  . Annual Exam    no pap  . Medication Refill    Nortriptyline    Subjective:   HPI 60 year old female presents for a complete physical exam.  Family history of mother had breast cancer which resulted in mastectomy. She reports recently mammogram that was normal. Due her mother's history she stays complaint with these screenings.  Her only chronic problem is migraine headaches which are controlled  with nortriptyline. She is uncertain of last real migraine. Reports feeling that the nortriptyline helps with her mood as well.    Social History   Social History  . Marital status: Married    Spouse name: N/A  . Number of children: N/A  . Years of education: N/A   Occupational History  . Not on file.   Social History Main Topics  . Smoking status: Former Games developermoker  . Smokeless tobacco: Never Used  . Alcohol use No  . Drug use: No  . Sexual activity: Not on file   Other Topics Concern  . Not on file   Social History Narrative  . No narrative on file    Family History  Problem Relation Age of Onset  . Cancer Mother      Review of Systems  Constitutional: Negative.        2 year ago vision exam  Eyes: Negative.        Last vision exam over 2 years ago.  Respiratory: Negative.   Cardiovascular: Negative.   Gastrointestinal: Negative.   Endocrine: Negative.   Genitourinary: Positive for frequency. Negative for decreased urine volume, urgency, vaginal bleeding, vaginal discharge and vaginal pain.       Urinary frequency has chronic. No recent UTI  Musculoskeletal: Negative.   Skin: Negative.   Allergic/Immunologic: Negative.   Neurological: Positive for headaches.       See HPI  Hematological: Negative.   Psychiatric/Behavioral: Negative.  Negative for agitation, decreased concentration and suicidal  ideas. The patient is not nervous/anxious.     Patient Active Problem List   Diagnosis Date Noted  . Migraine 12/16/2014     Prior to Admission medications   Medication Sig Start Date End Date Taking? Authorizing Provider  nortriptyline (PAMELOR) 75 MG capsule Take 1 capsule (75 mg total) by mouth at bedtime. 01/10/16  Yes Chelle Jeffery, PA-C   No Known Allergies     Objective:  Physical Exam  Constitutional: She is oriented to person, place, and time. She appears well-developed and well-nourished.  HENT:  Head: Normocephalic and atraumatic.  Right Ear: External ear normal.  Left Ear: External ear normal.  Nose: Nose normal.  Mouth/Throat: Oropharynx is clear and moist. No oropharyngeal exudate.  Eyes: Conjunctivae and EOM are normal. Pupils are equal, round, and reactive to light.  Neck: Normal range of motion. Neck supple. No JVD present. No thyromegaly present.  Cardiovascular: Normal rate, regular rhythm, normal heart sounds and intact distal pulses.   Pulmonary/Chest: Effort normal and breath sounds normal.  Abdominal: Soft. Bowel sounds are normal.  Musculoskeletal: Normal range of motion.  Lymphadenopathy:    She has no cervical adenopathy.  Neurological: She is alert and oriented to person, place, and time. She has normal reflexes.  Skin: Skin is warm. No rash noted. No erythema. No pallor.  Psychiatric: She has a  normal mood and affect. Her behavior is normal. Thought content normal.    Vitals:   01/23/16 1542  BP: 122/70  Pulse: 78  Resp: 16  Temp: 98.6 F (37 C)   Vitals:   01/23/16 1542  Weight: 133 lb (60.3 kg)  Height: 5\' 6"  (1.676 m)   Assessment & Plan:  1. Annual physical exam Age-appropriate anticipatory guidance provided.  2. Need for prophylactic vaccination and inoculation against influenza - Flu Vaccine QUAD 36+ mos IM  Discussed routine health maintenance items that are due.  Patient will follow-up regarding Colonscopy vs  Cologard. Next pap is due in 2019 as she is on the every 5 year rotation and all prior PAPs have been normal.  Will follow-up with you regarding your lab results.  Return for care in 12 months of sooner if needed.  Godfrey PickKimberly S. Tiburcio PeaHarris, MSN, FNP-C [facility]

## 2016-01-23 NOTE — Patient Instructions (Addendum)
Nice to meet you!  You will be contacted regarding your lab results.  PATIENT LEFT BEFORE LABS DRAWN. I L/M FOR PATIENT TO COME BACK FASTING FOR FAST TRACK LABS.  IF you received an x-ray today, you will receive an invoice from [facility]. Please contact [facility] at [phone] with questions or concerns regarding your invoice.   IF you received labwork today, you will receive an invoice from [facility]. Please contact [facility] at [phone] with questions or concerns regarding your invoice.   Our billing staff will not be able to assist you with questions regarding bills from these companies.  You will be contacted with the lab results as soon as they are available. The fastest way to get your results is to activate your My Chart account. Instructions are located on the last page of this paperwork. If you have not heard from us regarding the results in 2 weeks, please contact this office.      Exercising to Stay Healthy Exercising regularly is important. It has many health benefits, such as:  Improving your overall fitness, flexibility, and endurance.  Increasing your bone density.  Helping with weight control.  Decreasing your body fat.  Increasing your muscle strength.  Reducing stress and tension.  Improving your overall health. In order to become healthy and stay healthy, it is recommended that you do moderate-intensity and vigorous-intensity exercise. You can tell that you are exercising at a moderate intensity if you have a higher heart rate and faster breathing, but you are still able to hold a conversation. You can tell that you are exercising at a vigorous intensity if you are breathing much harder and faster and cannot hold a conversation while exercising. HOW OFTEN SHOULD I EXERCISE? Choose an activity that you enjoy and set realistic goals. Your health care provider can help you to make an activity plan that works for  you. Exercise regularly as directed by your health care provider. This may include:   Doing resistance training twice each week, such as:  Push-ups.  Sit-ups.  Lifting weights.  Using resistance bands.  Doing a given intensity of exercise for a given amount of time. Choose from these options:  150 minutes of moderate-intensity exercise every week.  75 minutes of vigorous-intensity exercise every week.  A mix of moderate-intensity and vigorous-intensity exercise every week. Children, pregnant women, people who are out of shape, people who are overweight, and older adults may need to consult a health care provider for individual recommendations. If you have any sort of medical condition, be sure to consult your health care provider before starting a new exercise program.  WHAT ARE SOME EXERCISE IDEAS? Some moderate-intensity exercise ideas include:   Walking at a rate of 1 mile in 15 minutes.  Biking.  Hiking.  Golfing.  Dancing. Some vigorous-intensity exercise ideas include:   Walking at a rate of at least 4.5 miles per hour.  Jogging or running at a rate of 5 miles per hour.  Biking at a rate of at least 10 miles per hour.  Lap swimming.  Roller-skating or in-line skating.  Cross-country skiing.  Vigorous competitive sports, such as football, basketball, and soccer.  Jumping rope.  Aerobic dancing. WHAT ARE SOME EVERYDAY ACTIVITIES THAT CAN HELP ME TO GET EXERCISE?  Yard work, such as:  Child psychotherapistushing a lawn mower.  Raking and bagging leaves.  Washing and waxing your car.  Pushing a stroller.  Shoveling snow.  Gardening.  Washing windows or floors. HOW  CAN I BE MORE ACTIVE IN MY DAY-TO-DAY ACTIVITIES?  Use the stairs instead of the elevator.  Take a walk during your lunch break.  If you drive, park your car farther away from work or school.  If you take public transportation, get off one stop early and walk the rest of the way.  Make all of your  phone calls while standing up and walking around.  Get up, stretch, and walk around every 30 minutes throughout the day. WHAT GUIDELINES SHOULD I FOLLOW WHILE EXERCISING?  Do not exercise so much that you hurt yourself, feel dizzy, or get very short of breath.  Consult your health care provider before starting a new exercise program.  Wear comfortable clothes and shoes with good support.  Drink plenty of water while you exercise to prevent dehydration or heat stroke. Body water is lost during exercise and must be replaced.  Work out until you breathe faster and your heart beats faster.   This information is not intended to replace advice given to you by your health care provider. Make sure you discuss any questions you have with your health care provider.   Document Released: 04/03/2010 Document Revised: 03/22/2014 Document Reviewed: 08/02/2013 Elsevier Interactive Patient Education [employer]2016 Elsevier Inc.

## 2016-01-29 ENCOUNTER — Other Ambulatory Visit: Payer: BLUE CROSS/BLUE SHIELD

## 2016-01-29 DIAGNOSIS — Z0000 Encounter for general adult medical examination without abnormal findings: Secondary | ICD-10-CM

## 2016-01-29 LAB — CBC WITH DIFFERENTIAL/PLATELET
BASOS ABS: 57 {cells}/uL (ref 0–200)
Basophils Relative: 1 %
EOS ABS: 855 {cells}/uL — AB (ref 15–500)
Eosinophils Relative: 15 %
HCT: 35.6 % (ref 35.0–45.0)
HEMOGLOBIN: 12 g/dL (ref 11.7–15.5)
LYMPHS ABS: 2166 {cells}/uL (ref 850–3900)
Lymphocytes Relative: 38 %
MCH: 31.2 pg (ref 27.0–33.0)
MCHC: 33.7 g/dL (ref 32.0–36.0)
MCV: 92.5 fL (ref 80.0–100.0)
MPV: 10.2 fL (ref 7.5–12.5)
Monocytes Absolute: 342 cells/uL (ref 200–950)
Monocytes Relative: 6 %
NEUTROS ABS: 2280 {cells}/uL (ref 1500–7800)
Neutrophils Relative %: 40 %
Platelets: 290 10*3/uL (ref 140–400)
RBC: 3.85 MIL/uL (ref 3.80–5.10)
RDW: 13.5 % (ref 11.0–15.0)
WBC: 5.7 10*3/uL (ref 3.8–10.8)

## 2016-01-29 LAB — COMPREHENSIVE METABOLIC PANEL
ALBUMIN: 4.6 g/dL (ref 3.6–5.1)
ALT: 13 U/L (ref 6–29)
AST: 19 U/L (ref 10–35)
Alkaline Phosphatase: 50 U/L (ref 33–130)
BUN: 12 mg/dL (ref 7–25)
CHLORIDE: 102 mmol/L (ref 98–110)
CO2: 25 mmol/L (ref 20–31)
CREATININE: 0.68 mg/dL (ref 0.50–1.05)
Calcium: 9.7 mg/dL (ref 8.6–10.4)
Glucose, Bld: 89 mg/dL (ref 65–99)
POTASSIUM: 4.6 mmol/L (ref 3.5–5.3)
SODIUM: 135 mmol/L (ref 135–146)
Total Bilirubin: 0.6 mg/dL (ref 0.2–1.2)
Total Protein: 7.3 g/dL (ref 6.1–8.1)

## 2016-01-29 LAB — LIPID PANEL
CHOL/HDL RATIO: 1.3 ratio (ref <5.0)
CHOLESTEROL: 207 mg/dL — AB (ref <200)
HDL: 156 mg/dL (ref >50)
LDL Cholesterol: 38 mg/dL (ref <100)
Triglycerides: 66 mg/dL (ref <150)
VLDL: 13 mg/dL (ref <30)

## 2016-01-29 LAB — TSH: TSH: 0.92 m[IU]/L

## 2016-01-30 ENCOUNTER — Encounter: Payer: Self-pay | Admitting: Family Medicine

## 2016-01-30 NOTE — Progress Notes (Signed)
ovember 17, 2017   Durel Salts [address] [place] [address]   Dear Ms. Jiggetts,  Below are the results from your recent visit were as expected.  Resulted Orders  CBC with Differential/Platelet  Result Value Ref Range   WBC 5.7 3.8 - 10.8 K/uL   RBC 3.85 3.80 - 5.10 MIL/uL   Hemoglobin 12.0 11.7 - 15.5 g/dL   HCT 35.6 35.0 - 45.0 %   MCV 92.5 80.0 - 100.0 fL   MCH 31.2 27.0 - 33.0 pg   MCHC 33.7 32.0 - 36.0 g/dL   RDW 13.5 11.0 - 15.0 %   Platelets 290 140 - 400 K/uL   MPV 10.2 7.5 - 12.5 fL   Neutro Abs 2,280 1,500 - 7,800 cells/uL   Lymphs Abs 2,166 850 - 3,900 cells/uL   Monocytes Absolute 342 200 - 950 cells/uL   Eosinophils Absolute 855 (H) 15 - 500 cells/uL   Basophils Absolute 57 0 - 200 cells/uL   Neutrophils Relative % 40 %   Lymphocytes Relative 38 %   Monocytes Relative 6 %   Eosinophils Relative 15 %   Basophils Relative 1 %   Smear Review Criteria for review not met    Narrative   Performed at:  [facility] [address], [address]                [place], [place] [address]  Lipid panel  Result Value Ref Range   Cholesterol 207 (H) <200 mg/dL     Comment:     ** Please note change in reference range(s). **      Triglycerides 66 <150 mg/dL     Comment:     ** Please note change in reference range(s). **      HDL 156 >50 mg/dL     Comment:     Result repeated and verified. Result confirmed by automatic dilution. ** Please note change in reference range(s). **      Total CHOL/HDL Ratio 1.3 <5.0 Ratio   VLDL 13 <30 mg/dL   LDL Cholesterol 38 <100 mg/dL     Comment:     ** Please note change in reference range(s). **      Narrative   Performed at:  [facility]                [address]                [place], [place] [address]  TSH  Result Value Ref Range   TSH 0.92 mIU/L     Comment:       Reference Range   > or = 20 Years  0.40-4.50   Pregnancy Range First trimester   0.26-2.66 Second trimester 0.55-2.73 Third trimester  0.43-2.91      Narrative   Performed at:  [facility]                [address], [address]                [place], [place] [address]  Comprehensive metabolic panel  Result Value Ref Range   Sodium 135 135 - 146 mmol/L   Potassium 4.6 3.5 - 5.3 mmol/L   Chloride 102 98 - 110 mmol/L   CO2 25 20 - 31 mmol/L   Glucose, Bld 89 65 - 99 mg/dL   BUN  12 7 - 25 mg/dL   Creat 0.68 0.50 - 1.05 mg/dL     Comment:       For patients > or = 58 years of age: The upper reference limit for Creatinine is approximately 13% higher for people identified as African-American.      Total Bilirubin 0.6 0.2 - 1.2 mg/dL   Alkaline Phosphatase 50 33 - 130 U/L   AST 19 10 - 35 U/L   ALT 13 6 - 29 U/L   Total Protein 7.3 6.1 - 8.1 g/dL   Albumin 4.6 3.6 - 5.1 g/dL   Calcium 9.7 8.6 - 10.4 mg/dL   Narrative   Performed at:  [address], [address]                [place], [place] [address]     If you have any questions or concerns, please don't hesitate to call.  Sincerely,   Carroll Sage. Kenton Kingfisher, MSN, FNP-C [facility]

## 2017-01-10 ENCOUNTER — Other Ambulatory Visit: Payer: Self-pay | Admitting: Family Medicine

## 2017-01-14 ENCOUNTER — Ambulatory Visit (INDEPENDENT_AMBULATORY_CARE_PROVIDER_SITE_OTHER): Payer: BLUE CROSS/BLUE SHIELD | Admitting: Physician Assistant

## 2017-01-14 ENCOUNTER — Encounter: Payer: Self-pay | Admitting: Physician Assistant

## 2017-01-14 VITALS — BP 108/70 | HR 87 | Temp 98.3°F | Resp 16 | Ht 66.75 in | Wt 137.4 lb

## 2017-01-14 DIAGNOSIS — Z13220 Encounter for screening for lipoid disorders: Secondary | ICD-10-CM | POA: Diagnosis not present

## 2017-01-14 DIAGNOSIS — Z1211 Encounter for screening for malignant neoplasm of colon: Secondary | ICD-10-CM

## 2017-01-14 DIAGNOSIS — Z0000 Encounter for general adult medical examination without abnormal findings: Secondary | ICD-10-CM

## 2017-01-14 DIAGNOSIS — G43809 Other migraine, not intractable, without status migrainosus: Secondary | ICD-10-CM

## 2017-01-14 DIAGNOSIS — Z1329 Encounter for screening for other suspected endocrine disorder: Secondary | ICD-10-CM

## 2017-01-14 DIAGNOSIS — Z23 Encounter for immunization: Secondary | ICD-10-CM | POA: Diagnosis not present

## 2017-01-14 DIAGNOSIS — Z130 Encounter for screening for diseases of the blood and blood-forming organs and certain disorders involving the immune mechanism: Secondary | ICD-10-CM

## 2017-01-14 LAB — POCT URINALYSIS DIP (MANUAL ENTRY)
Bilirubin, UA: NEGATIVE
Glucose, UA: NEGATIVE mg/dL
Ketones, POC UA: NEGATIVE mg/dL
Leukocytes, UA: NEGATIVE
Nitrite, UA: NEGATIVE
Protein Ur, POC: NEGATIVE mg/dL
Spec Grav, UA: 1.01 (ref 1.010–1.025)
Urobilinogen, UA: 0.2 E.U./dL
pH, UA: 5.5 (ref 5.0–8.0)

## 2017-01-14 MED ORDER — NORTRIPTYLINE HCL 75 MG PO CAPS: 75.0000 mg | ORAL_CAPSULE | Freq: Every day | ORAL | 3 refills | Status: DC

## 2017-01-14 NOTE — Progress Notes (Signed)
[facility] [place], [place] [address] [phone]  Date:  01/14/2017   Name:  [name] [name]   DOB:  1955/05/06   MRN:  [id number]  PCP:  Patient, No Pcp Per    Chief Complaint: Annual Exam and Medication Refill (Nortriptyline Pamelor 75 mg)   History of Present Illness:  This is a 61 y.o. female with PMH migraine, depression who is presenting for CPE. She is divorces with 2 children. Daughter is at [employer]. Her son is turning 21 soon - Estate agent at [employer].  She is fasting today.   Migraines - Nortriptyline 75 mg x 15 years. Last refill 01/2016. HA are "a lot better now". Very well controlled. Would like refill.   Complaints: none. Feeling well.  LMP: 8 years ago. Denies postmenopausal bleeding.  Contraception:  n/a Last pap: 2014. Next pap is due in 2019 as she is on the every 5 year rotation and all prior PAPs have been normal.  Sexual history: not active.  Immunizations: Needs flu shot  Eye: wears glasses. 20/15 b/l  Diet/Exercise: walks a lot. Is always active. Eats healthy meals. Lots of veggies.  Fam hx: Mother: breast cancer, mastectomy, stroke. Aunt breast cancer. Grandfather DM Tobacco/alcohol/substance use: none.   Mammogram: Regularly. Last 2017.   Colonoscopy: never. Is interested in cologuard.   Review of Systems:  Review of Systems  Constitutional: Negative for chills, diaphoresis, fatigue and fever.  HENT: Negative for congestion, postnasal drip, rhinorrhea, sinus pressure, sneezing and sore throat.   Respiratory: Negative for cough, chest tightness, shortness of breath and wheezing.   Cardiovascular: Negative for chest pain and palpitations.  Gastrointestinal: Negative for abdominal pain, diarrhea, nausea and vomiting.  Genitourinary: Negative for decreased urine volume, difficulty urinating, dysuria, enuresis, flank pain, frequency, hematuria and urgency.  Musculoskeletal: Negative for back pain.  Neurological: Negative  for dizziness, weakness, light-headedness and headaches.    Patient Active Problem List   Diagnosis Date Noted  . Migraine 12/16/2014    Prior to Admission medications   Medication Sig Start Date End Date Taking? Authorizing Provider  nortriptyline (PAMELOR) 75 MG capsule Take 1 capsule (75 mg total) by mouth at bedtime. 01/23/16  Yes Scot Jun, FNP    No Known Allergies  Past Surgical History:  Procedure Laterality Date  . BREAST SURGERY    . COSMETIC SURGERY    . KNEE SURGERY Left    20 yrs ago    Social History  Substance Use Topics  . Smoking status: Former Research scientist (life sciences)  . Smokeless tobacco: Never Used  . Alcohol use No    Family History  Problem Relation Age of Onset  . Cancer Mother   . Stroke Mother     Medication list has been reviewed and updated.  Physical Examination:  Physical Exam  Constitutional: She is oriented to person, place, and time. She appears well-developed and well-nourished. No distress.  HENT:  Head: Normocephalic and atraumatic.  Mouth/Throat: Oropharynx is clear and moist.  Eyes: Pupils are equal, round, and reactive to light. Conjunctivae and EOM are normal.  Neck: Normal range of motion. Neck supple. No thyromegaly present.  Cardiovascular: Normal rate, regular rhythm and normal heart sounds.   No murmur heard. Pulmonary/Chest: Effort normal and breath sounds normal. She has no wheezes.  Abdominal: Soft. Bowel sounds are normal. She exhibits no mass. There is no tenderness.  Musculoskeletal: Normal range of motion.  Neurological: She is alert and oriented to  person, place, and time. She has normal reflexes.  Skin: Skin is warm and dry.  Psychiatric: She has a normal mood and affect. Her behavior is normal. Judgment and thought content normal.  Vitals reviewed.   BP 108/70   Pulse 87   Temp 98.3 F (36.8 C) (Oral)   Resp 16   Ht 5' 6.75" (1.695 m)   Wt 137 lb 6.4 oz (62.3 kg)   SpO2 99%   BMI 21.68 kg/m   Assessment  and Plan: 1. Annual physical exam - Pt presents for annual exam. FHx breast cancer - plans to get MM soon. Never had colonoscopy , would like to try Cologuard - Rx written. Next pap is due 2019 - she is on the every 5 year rotation, all prior PAPs have been normal.  Migraines are well controlled with Pamelor - refill written. Anticipatory guidance provided. Will contact with lab results. RTC in 1 year for annual and PAP.  2. Other migraine without status migrainosus, not intractable - nortriptyline (PAMELOR) 75 MG capsule; Take 1 capsule (75 mg total) by mouth at bedtime.  Dispense: 90 capsule; Refill: 3  3. Needs flu shot - Flu Vaccine QUAD 36+ mos IM  4. Screen for colon cancer - Cologuard  5. Screening, lipid - Lipid panel  6. Screening for endocrine disorder - CMP14+EGFR - POCT urinalysis dipstick  7. Screening for deficiency anemia - CBC with Differential/Platelet   Mercer Pod, PA-C  [facility] 01/14/2017 11:12 AM

## 2017-01-14 NOTE — Patient Instructions (Addendum)
It was a pleasure meeting you today!  Be sure to call and make ann appt for your mammogram   You will receive Cologuard in the mail with instructions.  We will contact you with the results of today's lab work.   Thank you for coming in today. I hope you feel we met your needs.  Feel free to call PCP if you have any questions or further requests.  Please consider signing up for MyChart if you do not already have it, as this is a great way to communicate with me.  Best,  Whitney McVey, PA-C  Health Maintenance, Female Adopting a healthy lifestyle and getting preventive care can go a long way to promote health and wellness. Talk with your health care provider about what schedule of regular examinations is right for you. This is a good chance for you to check in with your provider about disease prevention and staying healthy. In between checkups, there are plenty of things you can do on your own. Experts have done a lot of research about which lifestyle changes and preventive measures are most likely to keep you healthy. Ask your health care provider for more information. Weight and diet Eat a healthy diet  Be sure to include plenty of vegetables, fruits, low-fat dairy products, and lean protein.  Do not eat a lot of foods high in solid fats, added sugars, or salt.  Get regular exercise. This is one of the most important things you can do for your health. ? Most adults should exercise for at least 150 minutes each week. The exercise should increase your heart rate and make you sweat (moderate-intensity exercise). ? Most adults should also do strengthening exercises at least twice a week. This is in addition to the moderate-intensity exercise.  Maintain a healthy weight  Body mass index (BMI) is a measurement that can be used to identify possible weight problems. It estimates body fat based on height and weight. Your health care provider can help determine your BMI and help you achieve or  maintain a healthy weight.  For females 77 years of age and older: ? A BMI below 18.5 is considered underweight. ? A BMI of 18.5 to 24.9 is normal. ? A BMI of 25 to 29.9 is considered overweight. ? A BMI of 30 and above is considered obese.  Watch levels of cholesterol and blood lipids  You should start having your blood tested for lipids and cholesterol at 59 years of age, then have this test every 5 years.  You may need to have your cholesterol levels checked more often if: ? Your lipid or cholesterol levels are high. ? You are older than 59 years of age. ? You are at high risk for heart disease.  Cancer screening Lung Cancer  Lung cancer screening is recommended for adults 89-38 years old who are at high risk for lung cancer because of a history of smoking.  A yearly low-dose CT scan of the lungs is recommended for people who: ? Currently smoke. ? Have quit within the past 15 years. ? Have at least a 30-pack-year history of smoking. A pack year is smoking an average of one pack of cigarettes a day for 1 year.  Yearly screening should continue until it has been 15 years since you quit.  Yearly screening should stop if you develop a health problem that would prevent you from having lung cancer treatment.  Breast Cancer  Practice breast self-awareness. This means understanding how your breasts normally  appear and feel.  It also means doing regular breast self-exams. Let your health care provider know about any changes, no matter how small.  If you are in your 20s or 30s, you should have a clinical breast exam (CBE) by a health care provider every 1-3 years as part of a regular health exam.  If you are 1 or older, have a CBE every year. Also consider having a breast X-ray (mammogram) every year.  If you have a family history of breast cancer, talk to your health care provider about genetic screening.  If you are at high risk for breast cancer, talk to your health care  provider about having an MRI and a mammogram every year.  Breast cancer gene (BRCA) assessment is recommended for women who have family members with BRCA-related cancers. BRCA-related cancers include: ? Breast. ? Ovarian. ? Tubal. ? Peritoneal cancers.  Results of the assessment will determine the need for genetic counseling and BRCA1 and BRCA2 testing.  Cervical Cancer Your health care provider may recommend that you be screened regularly for cancer of the pelvic organs (ovaries, uterus, and vagina). This screening involves a pelvic examination, including checking for microscopic changes to the surface of your cervix (Pap test). You may be encouraged to have this screening done every 3 years, beginning at age 2.  For women ages 62-65, health care providers may recommend pelvic exams and Pap testing every 3 years, or they may recommend the Pap and pelvic exam, combined with testing for human papilloma virus (HPV), every 5 years. Some types of HPV increase your risk of cervical cancer. Testing for HPV may also be done on women of any age with unclear Pap test results.  Other health care providers may not recommend any screening for nonpregnant women who are considered low risk for pelvic cancer and who do not have symptoms. Ask your health care provider if a screening pelvic exam is right for you.  If you have had past treatment for cervical cancer or a condition that could lead to cancer, you need Pap tests and screening for cancer for at least 20 years after your treatment. If Pap tests have been discontinued, your risk factors (such as having a new sexual partner) need to be reassessed to determine if screening should resume. Some women have medical problems that increase the chance of getting cervical cancer. In these cases, your health care provider may recommend more frequent screening and Pap tests.  Colorectal Cancer  This type of cancer can be detected and often prevented.  Routine  colorectal cancer screening usually begins at 59 years of age and continues through 59 years of age.  Your health care provider may recommend screening at an earlier age if you have risk factors for colon cancer.  Your health care provider may also recommend using home test kits to check for hidden blood in the stool.  A small camera at the end of a tube can be used to examine your colon directly (sigmoidoscopy or colonoscopy). This is done to check for the earliest forms of colorectal cancer.  Routine screening usually begins at age 38.  Direct examination of the colon should be repeated every 5-10 years through 59 years of age. However, you may need to be screened more often if early forms of precancerous polyps or small growths are found.  Skin Cancer  Check your skin from head to toe regularly.  Tell your health care provider about any new moles or changes in moles, especially  if there is a change in a mole's shape or color.  Also tell your health care provider if you have a mole that is larger than the size of a pencil eraser.  Always use sunscreen. Apply sunscreen liberally and repeatedly throughout the day.  Protect yourself by wearing long sleeves, pants, a wide-brimmed hat, and sunglasses whenever you are outside.  Heart disease, diabetes, and high blood pressure  High blood pressure causes heart disease and increases the risk of stroke. High blood pressure is more likely to develop in: ? People who have blood pressure in the high end of the normal range (130-139/85-89 mm Hg). ? People who are overweight or obese. ? People who are African American.  If you are 47-3 years of age, have your blood pressure checked every 3-5 years. If you are 27 years of age or older, have your blood pressure checked every year. You should have your blood pressure measured twice-once when you are at a hospital or clinic, and once when you are not at a hospital or clinic. Record the average of the  two measurements. To check your blood pressure when you are not at a hospital or clinic, you can use: ? An automated blood pressure machine at a pharmacy. ? A home blood pressure monitor.  If you are between 8 years and 56 years old, ask your health care provider if you should take aspirin to prevent strokes.  Have regular diabetes screenings. This involves taking a blood sample to check your fasting blood sugar level. ? If you are at a normal weight and have a low risk for diabetes, have this test once every three years after 59 years of age. ? If you are overweight and have a high risk for diabetes, consider being tested at a younger age or more often. Preventing infection Hepatitis B  If you have a higher risk for hepatitis B, you should be screened for this virus. You are considered at high risk for hepatitis B if: ? You were born in a country where hepatitis B is common. Ask your health care provider which countries are considered high risk. ? Your parents were born in a high-risk country, and you have not been immunized against hepatitis B (hepatitis B vaccine). ? You have HIV or AIDS. ? You use needles to inject street drugs. ? You live with someone who has hepatitis B. ? You have had sex with someone who has hepatitis B. ? You get hemodialysis treatment. ? You take certain medicines for conditions, including cancer, organ transplantation, and autoimmune conditions.  Hepatitis C  Blood testing is recommended for: ? Everyone born from [birth year] through 1963. ? Anyone with known risk factors for hepatitis C.  Sexually transmitted infections (STIs)  You should be screened for sexually transmitted infections (STIs) including gonorrhea and chlamydia if: ? You are sexually active and are younger than 59 years of age. ? You are older than 59 years of age and your health care provider tells you that you are at risk for this type of infection. ? Your sexual activity has changed since you  were last screened and you are at an increased risk for chlamydia or gonorrhea. Ask your health care provider if you are at risk.  If you do not have HIV, but are at risk, it may be recommended that you take a prescription medicine daily to prevent HIV infection. This is called pre-exposure prophylaxis (PrEP). You are considered at risk if: ? You are sexually active  and do not regularly use condoms or know the HIV status of your partner(s). ? You take drugs by injection. ? You are sexually active with a partner who has HIV.  Talk with your health care provider about whether you are at high risk of being infected with HIV. If you choose to begin PrEP, you should first be tested for HIV. You should then be tested every 3 months for as long as you are taking PrEP. Pregnancy  If you are premenopausal and you may become pregnant, ask your health care provider about preconception counseling.  If you may become pregnant, take 400 to 800 micrograms (mcg) of folic acid every day.  If you want to prevent pregnancy, talk to your health care provider about birth control (contraception). Osteoporosis and menopause  Osteoporosis is a disease in which the bones lose minerals and strength with aging. This can result in serious bone fractures. Your risk for osteoporosis can be identified using a bone density scan.  If you are 58 years of age or older, or if you are at risk for osteoporosis and fractures, ask your health care provider if you should be screened.  Ask your health care provider whether you should take a calcium or vitamin D supplement to lower your risk for osteoporosis.  Menopause may have certain physical symptoms and risks.  Hormone replacement therapy may reduce some of these symptoms and risks. Talk to your health care provider about whether hormone replacement therapy is right for you. Follow these instructions at home:  Schedule regular health, dental, and eye exams.  Stay current  with your immunizations.  Do not use any tobacco products including cigarettes, chewing tobacco, or electronic cigarettes.  If you are pregnant, do not drink alcohol.  If you are breastfeeding, limit how much and how often you drink alcohol.  Limit alcohol intake to no more than 1 drink per day for nonpregnant women. One drink equals 12 ounces of beer, 5 ounces of wine, or 1 ounces of hard liquor.  Do not use street drugs.  Do not share needles.  Ask your health care provider for help if you need support or information about quitting drugs.  Tell your health care provider if you often feel depressed.  Tell your health care provider if you have ever been abused or do not feel safe at home. This information is not intended to replace advice given to you by your health care provider. Make sure you discuss any questions you have with your health care provider. Document Released: 09/14/2010 Document Revised: 08/07/2015 Document Reviewed: 12/03/2014 Elsevier Interactive Patient Education  2018 [employer].  IF you received an x-ray today, you will receive an invoice from [facility]. Please contact [facility] at [phone] with questions or concerns regarding your invoice.   IF you received labwork today, you will receive an invoice from [facility]. Please contact [facility] at [phone] with questions or concerns regarding your invoice.   Our billing staff will not be able to assist you with questions regarding bills from these companies.  You will be contacted with the lab results as soon as they are available. The fastest way to get your results is to activate your My Chart account. Instructions are located on the last page of this paperwork. If you have not heard from [place] regarding the results in 2 weeks, please contact this office.

## 2017-01-15 LAB — CMP14+EGFR
ALT: 18 IU/L (ref 0–32)
AST: 23 IU/L (ref 0–40)
Albumin/Globulin Ratio: 2 (ref 1.2–2.2)
Albumin: 5 g/dL (ref 3.5–5.5)
Alkaline Phosphatase: 61 IU/L (ref 39–117)
BUN/Creatinine Ratio: 18 (ref 9–23)
BUN: 13 mg/dL (ref 6–24)
Bilirubin Total: 0.6 mg/dL (ref 0.0–1.2)
CO2: 23 mmol/L (ref 20–29)
Calcium: 9.4 mg/dL (ref 8.7–10.2)
Chloride: 100 mmol/L (ref 96–106)
Creatinine, Ser: 0.71 mg/dL (ref 0.57–1.00)
GFR calc Af Amer: 108 mL/min/{1.73_m2} (ref >59)
GFR calc non Af Amer: 94 mL/min/{1.73_m2} (ref >59)
Globulin, Total: 2.5 g/dL (ref 1.5–4.5)
Glucose: 93 mg/dL (ref 65–99)
Potassium: 4.3 mmol/L (ref 3.5–5.2)
Sodium: 139 mmol/L (ref 134–144)
Total Protein: 7.5 g/dL (ref 6.0–8.5)

## 2017-01-15 LAB — CBC WITH DIFFERENTIAL/PLATELET
Basophils Absolute: 0 x10E3/uL (ref 0.0–0.2)
Basos: 1 %
EOS (ABSOLUTE): 0.4 x10E3/uL (ref 0.0–0.4)
Eos: 10 %
Hematocrit: 35.8 % (ref 34.0–46.6)
Hemoglobin: 12.1 g/dL (ref 11.1–15.9)
Immature Grans (Abs): 0 x10E3/uL (ref 0.0–0.1)
Immature Granulocytes: 0 %
Lymphocytes Absolute: 1.5 x10E3/uL (ref 0.7–3.1)
Lymphs: 39 %
MCH: 31.2 pg (ref 26.6–33.0)
MCHC: 33.8 g/dL (ref 31.5–35.7)
MCV: 92 fL (ref 79–97)
Monocytes Absolute: 0.2 x10E3/uL (ref 0.1–0.9)
Monocytes: 6 %
Neutrophils Absolute: 1.6 x10E3/uL (ref 1.4–7.0)
Neutrophils: 44 %
Platelets: 289 x10E3/uL (ref 150–379)
RBC: 3.88 x10E6/uL (ref 3.77–5.28)
RDW: 13.8 % (ref 12.3–15.4)
WBC: 3.7 x10E3/uL (ref 3.4–10.8)

## 2017-01-15 LAB — LIPID PANEL
Chol/HDL Ratio: 1.6 ratio (ref 0.0–4.4)
Cholesterol, Total: 229 mg/dL — ABNORMAL HIGH (ref 100–199)
HDL: 145 mg/dL (ref >39)
LDL Calculated: 76 mg/dL (ref 0–99)
Triglycerides: 41 mg/dL (ref 0–149)
VLDL Cholesterol Cal: 8 mg/dL (ref 5–40)

## 2017-01-17 ENCOUNTER — Encounter: Payer: Self-pay | Admitting: Physician Assistant

## 2017-01-17 NOTE — Progress Notes (Signed)
Please call pt and let her know her labs look great: blood sugar, kidney and liver function, salts in her blood and blood counts look fantastic. Her total cholesterol is a little elevated. She can correct this through diet and exercise - I recommend adding a fish oil supplement to her diet. Come back in 1 year for annual. Thank you!

## 2017-01-17 NOTE — Progress Notes (Signed)
Consider urology consult if next UA is +blood

## 2017-01-18 ENCOUNTER — Encounter: Payer: Self-pay | Admitting: Radiology

## 2017-08-29 ENCOUNTER — Encounter: Payer: Self-pay | Admitting: Physician Assistant

## 2017-08-30 ENCOUNTER — Ambulatory Visit: Payer: BLUE CROSS/BLUE SHIELD

## 2017-08-31 ENCOUNTER — Ambulatory Visit (INDEPENDENT_AMBULATORY_CARE_PROVIDER_SITE_OTHER): Payer: BLUE CROSS/BLUE SHIELD | Admitting: Physician Assistant

## 2017-08-31 ENCOUNTER — Ambulatory Visit (INDEPENDENT_AMBULATORY_CARE_PROVIDER_SITE_OTHER): Payer: BLUE CROSS/BLUE SHIELD

## 2017-08-31 ENCOUNTER — Encounter: Payer: Self-pay | Admitting: Physician Assistant

## 2017-08-31 ENCOUNTER — Other Ambulatory Visit: Payer: Self-pay

## 2017-08-31 VITALS — BP 130/87 | HR 91 | Temp 97.8°F | Resp 16 | Ht 66.0 in | Wt 138.0 lb

## 2017-08-31 DIAGNOSIS — M79645 Pain in left finger(s): Secondary | ICD-10-CM

## 2017-08-31 DIAGNOSIS — Z1211 Encounter for screening for malignant neoplasm of colon: Secondary | ICD-10-CM | POA: Diagnosis not present

## 2017-08-31 DIAGNOSIS — Z114 Encounter for screening for human immunodeficiency virus [HIV]: Secondary | ICD-10-CM | POA: Diagnosis not present

## 2017-08-31 DIAGNOSIS — M79644 Pain in right finger(s): Secondary | ICD-10-CM

## 2017-08-31 DIAGNOSIS — Z23 Encounter for immunization: Secondary | ICD-10-CM

## 2017-08-31 DIAGNOSIS — Z1159 Encounter for screening for other viral diseases: Secondary | ICD-10-CM | POA: Diagnosis not present

## 2017-08-31 NOTE — Patient Instructions (Addendum)
Please take tylenol 1000 mg every 8 hours for pain.  This is amoung the safest medications that we have.    Dg Hand Complete Left  Result Date: 08/31/2017 CLINICAL DATA:  Multifocal joint pain. Family history of rheumatoid arthritis. EXAM: LEFT HAND - COMPLETE 3+ VIEW; RIGHT HAND - COMPLETE 3+ VIEW COMPARISON:  None. FINDINGS: There is some joint space narrowing and osteophytosis about the first CMC joints bilaterally, worse on the right. Joint spaces are otherwise maintained. Mineralization and alignment are normal. No erosion or periostitis. Mild osteophytosis about the IP joint of the right thumb is identified. Tiny calcification along the head of the proximal phalanx of the right thumb may be due to old trauma. No chondrocalcinosis. IMPRESSION: Negative for inflammatory arthropathy. 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 Electronically Signed   By: Drusilla Kannerhomas  Dalessio M.D.   On: 08/31/2017 15:04   Dg Hand Complete Right  Result Date: 08/31/2017 CLINICAL DATA:  Multifocal joint pain. Family history of rheumatoid arthritis. EXAM: LEFT HAND - COMPLETE 3+ VIEW; RIGHT HAND - COMPLETE 3+ VIEW COMPARISON:  None. FINDINGS: There is some joint space narrowing and osteophytosis about the first CMC joints bilaterally, worse on the right. Joint spaces are otherwise maintained. Mineralization and alignment are normal. No erosion or periostitis. Mild osteophytosis about the IP joint of the right thumb is identified. Tiny calcification along the head of the proximal phalanx of the right thumb may be due to old trauma. No chondrocalcinosis. IMPRESSION: Negative for inflammatory arthropathy. 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 Electronically Signed   By: Drusilla Kannerhomas  Dalessio M.D.   On: 08/31/2017 15:04     IF you received an x-ray today, you will receive an invoice from [facility]. Please contact [facility] at [phone] with  questions or concerns regarding your invoice.   IF you received labwork today, you will receive an invoice from [facility]. Please contact [facility] at [phone] with questions or concerns regarding your invoice.   Our billing staff will not be able to assist you with questions regarding bills from these companies.  You will be contacted with the lab results as soon as they are available. The fastest way to get your results is to activate your My Chart account. Instructions are located on the last page of this paperwork. If you have not heard from us regarding the results in 2 weeks, please contact this office.

## 2017-08-31 NOTE — Progress Notes (Signed)
08/31/2017 3:40 PM   DOB: Jul 09, 1955 / MRN: [id number]  SUBJECTIVE:  [name] [name] is a 62 y.o. female presenting for PIP finger pain and swelling. Symptoms present for about1 month.  The problem is worsening. She has tried nothing. Mother with hx or RA and she is concerned about that diagnosis.  She has No Known Allergies.   She  has a past medical history of Allergy and Depression.    She  reports that she quit smoking about 30 years ago. Her smoking use included cigarettes. She has a 10.00 pack-year smoking history. She has never used smokeless tobacco. She reports that she does not drink alcohol or use drugs. She  has no sexual activity history on file. The patient  has a past surgical history that includes Breast surgery; Cosmetic surgery; and Knee surgery (Left).  Her family history includes Cancer in her maternal aunt and mother; Diabetes in her maternal grandmother; Stroke in her mother.  Review of Systems  Respiratory: Negative for cough and shortness of breath.   Cardiovascular: Negative for chest pain.  Musculoskeletal: Positive for joint pain. Negative for back pain, myalgias and neck pain.    The problem list and medications were reviewed and updated by myself where necessary and exist elsewhere in the encounter.   OBJECTIVE:  BP 130/87 (BP Location: Right Arm, Patient Position: Sitting, Cuff Size: Normal)   Pulse 91   Temp 97.8 F (36.6 C) (Oral)   Resp 16   Ht 5\' 6"  (1.676 m)   Wt 138 lb (62.6 kg)   SpO2 99%   BMI 22.27 kg/m   Wt Readings from Last 3 Encounters:  08/31/17 138 lb (62.6 kg)  01/14/17 137 lb 6.4 oz (62.3 kg)  01/23/16 133 lb (60.3 kg)   Temp Readings from Last 3 Encounters:  08/31/17 97.8 F (36.6 C) (Oral)  01/14/17 98.3 F (36.8 C) (Oral)  01/23/16 98.6 F (37 C) (Oral)   BP Readings from Last 3 Encounters:  08/31/17 130/87  01/14/17 108/70  01/23/16 122/70   Pulse Readings from Last 3 Encounters:  08/31/17 91  01/14/17 87    01/23/16 78    Physical Exam  Constitutional: She is oriented to person, place, and time. She appears well-nourished. No distress.  Eyes: Pupils are equal, round, and reactive to light. EOM are normal.  Cardiovascular: Normal rate.  Pulmonary/Chest: Effort normal.  Abdominal: She exhibits no distension.  Musculoskeletal:       Right hand: She exhibits swelling (PIP). She exhibits no tenderness and no bony tenderness. Normal strength noted.       Left hand: She exhibits swelling (PIPs). She exhibits no tenderness, no bony tenderness and no deformity. Normal strength noted.  Neurological: She is alert and oriented to person, place, and time. No cranial nerve deficit. Gait normal.  Skin: Skin is dry. She is not diaphoretic.  Psychiatric: She has a normal mood and affect.  Vitals reviewed.     Lab Results  Component Value Date   WBC 3.7 01/14/2017   HGB 12.1 01/14/2017   HCT 35.8 01/14/2017   MCV 92 01/14/2017   PLT 289 01/14/2017    Lab Results  Component Value Date   CREATININE 0.71 01/14/2017   BUN 13 01/14/2017   NA 139 01/14/2017   K 4.3 01/14/2017   CL 100 01/14/2017   CO2 23 01/14/2017    Lab Results  Component Value Date   ALT 18 01/14/2017   AST 23 01/14/2017  ALKPHOS 61 01/14/2017   BILITOT 0.6 01/14/2017    Lab Results  Component Value Date   TSH 0.92 01/29/2016    Lab Results  Component Value Date   CHOL 229 (H) 01/14/2017   HDL 145 01/14/2017   LDLCALC 76 01/14/2017   TRIG 41 01/14/2017   CHOLHDL 1.6 01/14/2017   Dg Hand Complete Left  Result Date: 08/31/2017 CLINICAL DATA:  Multifocal joint pain. Family history of rheumatoid arthritis. EXAM: LEFT HAND - COMPLETE 3+ VIEW; RIGHT HAND - COMPLETE 3+ VIEW COMPARISON:  None. FINDINGS: There is some joint space narrowing and osteophytosis about the first CMC joints bilaterally, worse on the right. Joint spaces are otherwise maintained. Mineralization and alignment are normal. No erosion or  periostitis. Mild osteophytosis about the IP joint of the right thumb is identified. Tiny calcification along the head of the proximal phalanx of the right thumb may be due to old trauma. No chondrocalcinosis. IMPRESSION: Negative for inflammatory arthropathy. 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 Electronically Signed   By: Drusilla Kanner M.D.   On: 08/31/2017 15:04   Dg Hand Complete Right  Result Date: 08/31/2017 CLINICAL DATA:  Multifocal joint pain. Family history of rheumatoid arthritis. EXAM: LEFT HAND - COMPLETE 3+ VIEW; RIGHT HAND - COMPLETE 3+ VIEW COMPARISON:  None. FINDINGS: There is some joint space narrowing and osteophytosis about the first CMC joints bilaterally, worse on the right. Joint spaces are otherwise maintained. Mineralization and alignment are normal. No erosion or periostitis. Mild osteophytosis about the IP joint of the right thumb is identified. Tiny calcification along the head of the proximal phalanx of the right thumb may be due to old trauma. No chondrocalcinosis. IMPRESSION: Negative for inflammatory arthropathy. 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 Electronically Signed   By: Drusilla Kanner M.D.   On: 08/31/2017 15:04     ASSESSMENT AND PLAN:  [name] [name] was seen today for hand problem.  Diagnoses and all orders for this visit:  Pain in finger of both hands -     DG Hand Complete Left; Future -     DG Hand Complete Right; Future -     Rheumatoid factor -     CYCLIC CITRUL PEPTIDE ANTIBODY, IGG/IGA -     Sedimentation Rate  Special screening for malignant neoplasms, colon -     Cologuard  Screening for HIV (human immunodeficiency virus) -     HIV antibody  Need for hepatitis C screening test -     Hepatitis C antibody  Need for diphtheria-tetanus-pertussis (Tdap) vaccine -     Tdap vaccine greater than or equal to 7yo IM    The patient is advised to call or return to clinic  if she does not see an improvement in symptoms, or to seek the care of the closest emergency department if she worsens with the above plan.   Deliah Boston, MHS, PA-C Primary Care at [facility] 08/31/2017 3:40 PM

## 2017-09-01 LAB — CYCLIC CITRUL PEPTIDE ANTIBODY, IGG/IGA: CYCLIC CITRULLIN PEPTIDE AB: 9 U (ref 0–19)

## 2017-09-01 LAB — HEPATITIS C ANTIBODY: Hep C Virus Ab: <0.1 s/co ratio (ref 0.0–0.9)

## 2017-09-01 LAB — SEDIMENTATION RATE: Sed Rate: 6 mm/hr (ref 0–40)

## 2017-09-01 LAB — HIV ANTIBODY (ROUTINE TESTING W REFLEX): HIV Screen 4th Generation wRfx: NONREACTIVE

## 2017-09-01 LAB — RHEUMATOID FACTOR: RHEUMATOID FACTOR: 13.3 [IU]/mL (ref 0.0–13.9)

## 2017-09-27 ENCOUNTER — Other Ambulatory Visit: Payer: Self-pay | Admitting: Physician Assistant

## 2017-09-27 DIAGNOSIS — Z1231 Encounter for screening mammogram for malignant neoplasm of breast: Secondary | ICD-10-CM

## 2017-10-18 ENCOUNTER — Ambulatory Visit
Admission: RE | Admit: 2017-10-18 | Discharge: 2017-10-18 | Disposition: A | Discharge status: Home / Self Care | Payer: BLUE CROSS/BLUE SHIELD | Source: Ambulatory Visit | Attending: Physician Assistant | Admitting: Physician Assistant

## 2017-10-18 DIAGNOSIS — Z1231 Encounter for screening mammogram for malignant neoplasm of breast: Secondary | ICD-10-CM

## 2017-11-30 ENCOUNTER — Other Ambulatory Visit: Payer: Self-pay | Admitting: Physician Assistant

## 2017-11-30 DIAGNOSIS — G43809 Other migraine, not intractable, without status migrainosus: Secondary | ICD-10-CM

## 2017-12-13 LAB — COLOGUARD: COLOGUARD: NEGATIVE

## 2017-12-13 NOTE — Addendum Note (Signed)
Addended by: Baldwin Crown D on: 12/13/2017 10:13 AM   Modules accepted: Orders

## 2018-01-19 ENCOUNTER — Ambulatory Visit (INDEPENDENT_AMBULATORY_CARE_PROVIDER_SITE_OTHER): Payer: BLUE CROSS/BLUE SHIELD | Admitting: Physician Assistant

## 2018-01-19 ENCOUNTER — Other Ambulatory Visit: Payer: Self-pay

## 2018-01-19 ENCOUNTER — Encounter: Payer: Self-pay | Admitting: Physician Assistant

## 2018-01-19 VITALS — BP 136/82 | HR 96 | Temp 98.7°F | Resp 18 | Ht 64.45 in | Wt 134.4 lb

## 2018-01-19 DIAGNOSIS — M545 Low back pain, unspecified: Secondary | ICD-10-CM

## 2018-01-19 DIAGNOSIS — Z23 Encounter for immunization: Secondary | ICD-10-CM

## 2018-01-19 DIAGNOSIS — Z1329 Encounter for screening for other suspected endocrine disorder: Secondary | ICD-10-CM | POA: Diagnosis not present

## 2018-01-19 DIAGNOSIS — Z130 Encounter for screening for diseases of the blood and blood-forming organs and certain disorders involving the immune mechanism: Secondary | ICD-10-CM

## 2018-01-19 DIAGNOSIS — G43809 Other migraine, not intractable, without status migrainosus: Secondary | ICD-10-CM

## 2018-01-19 DIAGNOSIS — Z13220 Encounter for screening for lipoid disorders: Secondary | ICD-10-CM

## 2018-01-19 DIAGNOSIS — H547 Unspecified visual loss: Secondary | ICD-10-CM

## 2018-01-19 DIAGNOSIS — Z0000 Encounter for general adult medical examination without abnormal findings: Secondary | ICD-10-CM | POA: Diagnosis not present

## 2018-01-19 DIAGNOSIS — Z13228 Encounter for screening for other metabolic disorders: Secondary | ICD-10-CM

## 2018-01-19 DIAGNOSIS — Z1389 Encounter for screening for other disorder: Secondary | ICD-10-CM

## 2018-01-19 DIAGNOSIS — Z124 Encounter for screening for malignant neoplasm of cervix: Secondary | ICD-10-CM

## 2018-01-19 LAB — POCT URINALYSIS DIP (MANUAL ENTRY)
BILIRUBIN UA: NEGATIVE mg/dL
Bilirubin, UA: NEGATIVE
Blood, UA: NEGATIVE
GLUCOSE UA: NEGATIVE mg/dL
LEUKOCYTES UA: NEGATIVE
Nitrite, UA: NEGATIVE
Protein Ur, POC: NEGATIVE mg/dL
Urobilinogen, UA: 0.2 E.U./dL
pH, UA: 6 (ref 5.0–8.0)

## 2018-01-19 MED ORDER — NORTRIPTYLINE HCL 75 MG PO CAPS: 75.0000 mg | ORAL_CAPSULE | Freq: Every day | ORAL | 3 refills | Status: AC

## 2018-01-19 MED ORDER — ZOSTER VAC RECOMB ADJUVANTED 50 MCG/0.5ML IM SUSR: 0.5000 mL | Freq: Once | INTRAMUSCULAR | 0 refills | Status: AC

## 2018-01-19 NOTE — Progress Notes (Signed)
[name] [name]  MRN: [id number] DOB: October 08, 1955  Subjective:  Pt is a 62 y.o. female who presents for annual physical exam. Pt is not fasting today.   Primary Preventative Screenings: Cervical Cancer: 2014, normal Family Planning: 2 children, postmenopausal. STI screening: Not sexually active Breast Cancer: 2019, FH of breast cancer in mother and aunt in 27s.  Colorectal Cancer: Cologaurd negative in 2019, no FH of colon cancer  Bone Density: n/a Cardiac: EKG in 2013 wnl Weight/Blood sugar/Diet/Exercise: walks a lot, walks her dog, eats very healthy, loves veggies and salmon, drinks mostly water OTC/Vit/Supp/Herbal: fish oil, colace q other day Dentist/Optho: Dentist every couple months, brushes BID; eye exam annually, wears Rx eyeglasses Immunizations: Tdap: 2019 Flu: 2018 Shingrix: Never   Patient Active Problem List   Diagnosis Date Noted  . Migraine 12/16/2014    No current outpatient medications on file prior to visit.   No current facility-administered medications on file prior to visit.     No Known Allergies  Social History   Socioeconomic History  . Marital status: Divorced    Spouse name: Not on file  . Number of children: 2  . Years of education: Not on file  . Highest education level: Not on file  Occupational History  . Not on file  Social Needs  . Financial resource strain: Not hard at all  . Food insecurity:    Worry: Never true    Inability: Never true  . Transportation needs:    Medical: No    Non-medical: No  Tobacco Use  . Smoking status: Former Smoker    Packs/day: 1.00    Years: 10.00    Pack years: 10.00    Types: Cigarettes    Last attempt to quit: 1989    Years since quitting: 30.8  . Smokeless tobacco: Never Used  Substance and Sexual Activity  . Alcohol use: Yes    Alcohol/week: 0.0 standard drinks    Comment: 3 a week  . Drug use: No  . Sexual activity: Not Currently  Lifestyle  . Physical activity:    Days per week: 3  days    Minutes per session: 50 min  . Stress: Not at all  Relationships  . Social connections:    Talks on phone: Not on file    Gets together: Not on file    Attends religious service: Not on file    Active member of club or organization: Not on file    Attends meetings of clubs or organizations: Not on file    Relationship status: Not on file  Other Topics Concern  . Not on file  Social History Narrative  . Not on file    Past Surgical History:  Procedure Laterality Date  . BREAST EXCISIONAL BIOPSY Bilateral   . BREAST SURGERY    . COSMETIC SURGERY    . KNEE SURGERY Left    20 yrs ago    Family History  Problem Relation Age of Onset  . Cancer Mother        breast  . Stroke Mother   . Breast cancer Mother   . Breast cancer Sister   . Diabetes Maternal Grandmother   . Cancer Maternal Aunt        breast    Review of Systems  Constitutional: Negative for activity change, appetite change, chills, diaphoresis, fatigue, fever and unexpected weight change.  HENT: Negative for congestion, dental problem, drooling, ear discharge, ear pain, facial swelling, hearing loss, mouth sores,  nosebleeds, postnasal drip, rhinorrhea, sinus pressure, sinus pain, sneezing, sore throat, tinnitus, trouble swallowing and voice change.   Eyes: Negative for photophobia, pain, discharge, redness, itching and visual disturbance.  Respiratory: Negative for apnea, cough, choking, chest tightness, shortness of breath, wheezing and stridor.   Cardiovascular: Negative for chest pain, palpitations and leg swelling.  Gastrointestinal: Negative for abdominal distention, abdominal pain, anal bleeding, blood in stool, constipation, diarrhea, nausea, rectal pain and vomiting.  Endocrine: Negative for cold intolerance, heat intolerance, polydipsia, polyphagia and polyuria.  Genitourinary: Negative for decreased urine volume, difficulty urinating, dyspareunia, dysuria, enuresis, flank pain, frequency, genital  sores, hematuria, menstrual problem, pelvic pain, urgency, vaginal bleeding, vaginal discharge and vaginal pain.  Musculoskeletal: Positive for back pain (intermittent low back pain since helping mom move a few weeks ago, no saddle anesthesia or bladder/bowel incontinence). Negative for arthralgias, gait problem, joint swelling, myalgias, neck pain and neck stiffness.  Skin: Negative for color change, pallor, rash and wound.  Allergic/Immunologic: Negative for environmental allergies, food allergies and immunocompromised state.  Neurological: Negative for dizziness, tremors, seizures, syncope, facial asymmetry, speech difficulty, weakness, light-headedness, numbness and headaches.  Hematological: Negative for adenopathy. Does not bruise/bleed easily.  Psychiatric/Behavioral: Negative for agitation, behavioral problems, confusion, decreased concentration, dysphoric mood, hallucinations, self-injury, sleep disturbance and suicidal ideas. The patient is not nervous/anxious and is not hyperactive.     Objective:  BP 136/82   Pulse 96   Temp 98.7 F (37.1 C) (Oral)   Resp 18   Ht 5' 4.45" (1.637 m)   Wt 134 lb 6.4 oz (61 kg)   SpO2 98%   BMI 22.75 kg/m   Physical Exam  Constitutional: She is oriented to person, place, and time. She appears well-developed and well-nourished. No distress.  HENT:  Head: Normocephalic and atraumatic.  Right Ear: Hearing, tympanic membrane, external ear and ear canal normal.  Left Ear: Hearing, tympanic membrane, external ear and ear canal normal.  Nose: Nose normal.  Mouth/Throat: Uvula is midline, oropharynx is clear and moist and mucous membranes are normal. No oropharyngeal exudate.  Eyes: Pupils are equal, round, and reactive to light. Conjunctivae, EOM and lids are normal. No scleral icterus.  Neck: Trachea normal and normal range of motion. No thyroid mass and no thyromegaly present.  Cardiovascular: Normal rate, regular rhythm, normal heart sounds and  intact distal pulses.  Pulmonary/Chest: Effort normal and breath sounds normal. She exhibits no mass, no tenderness, no laceration, no crepitus, no edema, no deformity, no swelling and no retraction.  Abdominal: Soft. Normal appearance and bowel sounds are normal. There is no tenderness.  Genitourinary: Vagina normal and uterus normal. Cervix exhibits no motion tenderness. Right adnexum displays no mass, no tenderness and no fullness. Left adnexum displays no mass, no tenderness and no fullness.  Genitourinary Comments: CMA chaperone present for GU and breast exam  Musculoskeletal:       Lumbar back: She exhibits tenderness (mild reproducible TTP with palpation of b/l lumbar region). She exhibits normal range of motion, no bony tenderness and no spasm.  Lymphadenopathy:       Head (right side): No tonsillar, no preauricular, no posterior auricular and no occipital adenopathy present.       Head (left side): No tonsillar, no preauricular, no posterior auricular and no occipital adenopathy present.    She has no cervical adenopathy.       Right: No supraclavicular adenopathy present.       Left: No supraclavicular adenopathy present.  Neurological: She is alert  and oriented to person, place, and time. She has normal strength and normal reflexes. No sensory deficit. Gait normal.  Neg SLR b/l  Skin: Skin is warm and dry.    Visual Acuity Screening   Right eye Left eye Both eyes  Without correction:     With correction: 20/30 20/200 20/30   BP Readings from Last 3 Encounters:  01/19/18 136/82  08/31/17 130/87  01/14/17 108/70   Results for orders placed or performed in visit on 01/19/18 (from the past 24 hour(s))  POCT urinalysis dipstick     Status: Abnormal   Collection Time: 01/19/18  1:52 PM  Result Value Ref Range   Color, UA yellow yellow   Clarity, UA clear clear   Glucose, UA negative negative mg/dL   Bilirubin, UA negative negative   Ketones, POC UA negative negative mg/dL    Spec Grav, UA <=1.005 (A) 1.010 - 1.025   Blood, UA negative negative   pH, UA 6.0 5.0 - 8.0   Protein Ur, POC negative negative mg/dL   Urobilinogen, UA 0.2 0.2 or 1.0 E.U./dL   Nitrite, UA Negative Negative   Leukocytes, UA Negative Negative     Assessment and Plan :  Discussed healthy lifestyle, diet, exercise, preventative care, vaccinations, and addressed patient's concerns. Plan for follow up in one year. Otherwise, plan for specific conditions below.  1. Annual physical exam Await lab results.  2. Need for influenza vaccination - Flu Vaccine QUAD 36+ mos IM  3. Screening, lipid - Lipid panel  4. Screening for deficiency anemia - CBC with Differential/Platelet  5. Screening for thyroid disorder - TSH  6. Screening for hematuria or proteinuria - POCT urinalysis dipstick  7. Screening for cervical cancer - Pap IG and HPV (high risk) DNA detection  8. Screening for metabolic disorder - [id number]  9. Other migraine without status migrainosus, not intractable - nortriptyline (PAMELOR) 75 MG capsule; Take 1 capsule (75 mg total) by mouth at bedtime.  Dispense: 90 capsule; Refill: 3  10. Acute bilateral low back pain without sciatica Consistent with low back strain.  No midline tenderness.  Neuro exam normal.  She does not want medication.  Recommended heating pad, stretching, and massage therapy.  Follow-up as needed.  11. Decreased visual acuity Visual acuity with correction shows 20/200 in left eye, patient reports that this is her baseline and is why she wears eyeglasses.     Tenna Delaine, PA-C  [facility] 01/19/2018 5:44 PM

## 2018-01-19 NOTE — Patient Instructions (Addendum)
[facility]: [facility] in [place] Shingles vaccine is at your pharmacy.   Health Maintenance for Postmenopausal Women Menopause is a normal process in which your reproductive ability comes to an end. This process happens gradually over a span of months to years, usually between the ages of 19 and 76. Menopause is complete when you have missed 12 consecutive menstrual periods. It is important to talk with your health care provider about some of the most common conditions that affect postmenopausal women, such as heart disease, cancer, and bone loss (osteoporosis). Adopting a healthy lifestyle and getting preventive care can help to promote your health and wellness. Those actions can also lower your chances of developing some of these common conditions. What should I know about menopause? During menopause, you may experience a number of symptoms, such as:  Moderate-to-severe hot flashes.  Night sweats.  Decrease in sex drive.  Mood swings.  Headaches.  Tiredness.  Irritability.  Memory problems.  Insomnia.  Choosing to treat or not to treat menopausal changes is an individual decision that you make with your health care provider. What should I know about hormone replacement therapy and supplements? Hormone therapy products are effective for treating symptoms that are associated with menopause, such as hot flashes and night sweats. Hormone replacement carries certain risks, especially as you become older. If you are thinking about using estrogen or estrogen with progestin treatments, discuss the benefits and risks with your health care provider. What should I know about heart disease and stroke? Heart disease, heart attack, and stroke become more likely as you age. This may be due, in part, to the hormonal changes that your body experiences during menopause. These can affect how your body processes dietary fats, triglycerides, and cholesterol. Heart attack and stroke are both  medical emergencies. There are many things that you can do to help prevent heart disease and stroke:  Have your blood pressure checked at least every 1-2 years. High blood pressure causes heart disease and increases the risk of stroke.  If you are 54-75 years old, ask your health care provider if you should take aspirin to prevent a heart attack or a stroke.  Do not use any tobacco products, including cigarettes, chewing tobacco, or electronic cigarettes. If you need help quitting, ask your health care provider.  It is important to eat a healthy diet and maintain a healthy weight. ? Be sure to include plenty of vegetables, fruits, low-fat dairy products, and lean protein. ? Avoid eating foods that are high in solid fats, added sugars, or salt (sodium).  Get regular exercise. This is one of the most important things that you can do for your health. ? Try to exercise for at least 150 minutes each week. The type of exercise that you do should increase your heart rate and make you sweat. This is known as moderate-intensity exercise. ? Try to do strengthening exercises at least twice each week. Do these in addition to the moderate-intensity exercise.  Know your numbers.Ask your health care provider to check your cholesterol and your blood glucose. Continue to have your blood tested as directed by your health care provider.  What should I know about cancer screening? There are several types of cancer. Take the following steps to reduce your risk and to catch any cancer development as early as possible. Breast Cancer  Practice breast self-awareness. ? This means understanding how your breasts normally appear and feel. ? It also means doing regular breast self-exams. Let your health care  provider know about any changes, no matter how small.  If you are 40 or older, have a clinician do a breast exam (clinical breast exam or CBE) every year. Depending on your age, family history, and medical  history, it may be recommended that you also have a yearly breast X-ray (mammogram).  If you have a family history of breast cancer, talk with your health care provider about genetic screening.  If you are at high risk for breast cancer, talk with your health care provider about having an MRI and a mammogram every year.  Breast cancer (BRCA) gene test is recommended for women who have family members with BRCA-related cancers. Results of the assessment will determine the need for genetic counseling and BRCA1 and for BRCA2 testing. BRCA-related cancers include these types: ? Breast. This occurs in males or females. ? Ovarian. ? Tubal. This may also be called fallopian tube cancer. ? Cancer of the abdominal or pelvic lining (peritoneal cancer). ? Prostate. ? Pancreatic.  Cervical, Uterine, and Ovarian Cancer Your health care provider may recommend that you be screened regularly for cancer of the pelvic organs. These include your ovaries, uterus, and vagina. This screening involves a pelvic exam, which includes checking for microscopic changes to the surface of your cervix (Pap test).  For women ages 21-65, health care providers may recommend a pelvic exam and a Pap test every three years. For women ages 6-65, they may recommend the Pap test and pelvic exam, combined with testing for human papilloma virus (HPV), every five years. Some types of HPV increase your risk of cervical cancer. Testing for HPV may also be done on women of any age who have unclear Pap test results.  Other health care providers may not recommend any screening for nonpregnant women who are considered low risk for pelvic cancer and have no symptoms. Ask your health care provider if a screening pelvic exam is right for you.  If you have had past treatment for cervical cancer or a condition that could lead to cancer, you need Pap tests and screening for cancer for at least 20 years after your treatment. If Pap tests have been  discontinued for you, your risk factors (such as having a new sexual partner) need to be reassessed to determine if you should start having screenings again. Some women have medical problems that increase the chance of getting cervical cancer. In these cases, your health care provider may recommend that you have screening and Pap tests more often.  If you have a family history of uterine cancer or ovarian cancer, talk with your health care provider about genetic screening.  If you have vaginal bleeding after reaching menopause, tell your health care provider.  There are currently no reliable tests available to screen for ovarian cancer.  Lung Cancer Lung cancer screening is recommended for adults 5-31 years old who are at high risk for lung cancer because of a history of smoking. A yearly low-dose CT scan of the lungs is recommended if you:  Currently smoke.  Have a history of at least 30 pack-years of smoking and you currently smoke or have quit within the past 15 years. A pack-year is smoking an average of one pack of cigarettes per day for one year.  Yearly screening should:  Continue until it has been 15 years since you quit.  Stop if you develop a health problem that would prevent you from having lung cancer treatment.  Colorectal Cancer  This type of cancer can be  detected and can often be prevented.  Routine colorectal cancer screening usually begins at age 51 and continues through age 37.  If you have risk factors for colon cancer, your health care provider may recommend that you be screened at an earlier age.  If you have a family history of colorectal cancer, talk with your health care provider about genetic screening.  Your health care provider may also recommend using home test kits to check for hidden blood in your stool.  A small camera at the end of a tube can be used to examine your colon directly (sigmoidoscopy or colonoscopy). This is done to check for the earliest  forms of colorectal cancer.  Direct examination of the colon should be repeated every 5-10 years until age 69. However, if early forms of precancerous polyps or small growths are found or if you have a family history or genetic risk for colorectal cancer, you may need to be screened more often.  Skin Cancer  Check your skin from head to toe regularly.  Monitor any moles. Be sure to tell your health care provider: ? About any new moles or changes in moles, especially if there is a change in a mole's shape or color. ? If you have a mole that is larger than the size of a pencil eraser.  If any of your family members has a history of skin cancer, especially at a young age, talk with your health care provider about genetic screening.  Always use sunscreen. Apply sunscreen liberally and repeatedly throughout the day.  Whenever you are outside, protect yourself by wearing long sleeves, pants, a wide-brimmed hat, and sunglasses.  What should I know about osteoporosis? Osteoporosis is a condition in which bone destruction happens more quickly than new bone creation. After menopause, you may be at an increased risk for osteoporosis. To help prevent osteoporosis or the bone fractures that can happen because of osteoporosis, the following is recommended:  If you are 47-38 years old, get at least 1,000 mg of calcium and at least 600 mg of vitamin D per day.  If you are older than age 38 but younger than age 33, get at least 1,200 mg of calcium and at least 600 mg of vitamin D per day.  If you are older than age 32, get at least 1,200 mg of calcium and at least 800 mg of vitamin D per day.  Smoking and excessive alcohol intake increase the risk of osteoporosis. Eat foods that are rich in calcium and vitamin D, and do weight-bearing exercises several times each week as directed by your health care provider. What should I know about how menopause affects my mental health? Depression may occur at any  age, but it is more common as you become older. Common symptoms of depression include:  Low or sad mood.  Changes in sleep patterns.  Changes in appetite or eating patterns.  Feeling an overall lack of motivation or enjoyment of activities that you previously enjoyed.  Frequent crying spells.  Talk with your health care provider if you think that you are experiencing depression. What should I know about immunizations? It is important that you get and maintain your immunizations. These include:  Tetanus, diphtheria, and pertussis (Tdap) booster vaccine.  Influenza every year before the flu season begins.  Pneumonia vaccine.  Shingles vaccine.  Your health care provider may also recommend other immunizations. This information is not intended to replace advice given to you by your health care provider. Make sure  you discuss any questions you have with your health care provider. Document Released: 04/23/2005 Document Revised: 09/19/2015 Document Reviewed: 12/03/2014 Elsevier Interactive Patient Education  [employer].   I recommend resting today. However, tomorrow I would begin walking and moving around as much as tolerated. Begin stretching in a couple of  Please perform exercises below. Stretches are to be performed for 2 sets, holding 10-15 seconds each. Recommended to perform this rehab twice daily within pain tolerance for 2 weeks.   FLEXION RANGE OF MOTION AND STRETCHING EXERCISES: STRETCH - Flexion, Single Knee to Chest   Lie on a firm bed or floor with both legs extended in front of you.  Keeping one leg in contact with the floor, bring your opposite knee to your chest. Hold your leg in place by either grabbing behind your thigh or at your knee.  Pull until you feel a gentle stretch in your lower back.   Slowly release your grasp and repeat the exercise with the opposite side.  STRETCH - Flexion, Double Knee to Chest   Lie on a firm bed or floor with both  legs extended in front of you.  Keeping one leg in contact with the floor, bring your opposite knee to your chest.  Tense your stomach muscles to support your back and then lift your other knee to your chest. Hold your legs in place by either grabbing behind your thighs or at your knees.  Pull both knees toward your chest until you feel a gentle stretch in your lower back.   Tense your stomach muscles and slowly return one leg at a time to the floor.  STRETCH - Low Trunk Rotation  Lie on a firm bed or floor. Keeping your legs in front of you, bend your knees so they are both pointed toward the ceiling and your feet are flat on the floor.  Extend your arms out to the side. This will stabilize your upper body by keeping your shoulders in contact with the floor.  Gently and slowly drop both knees together to one side until you feel a gentle stretch in your lower back.   Tense your stomach muscles to support your lower back as you bring your knees back to the starting position. Repeat the exercise to the other side.   EXTENSION RANGE OF MOTION AND FLEXIBILITY EXERCISES: STRETCH - Extension, Prone on Elbows   Lie on your stomach on the floor, a bed will be too soft. Place your palms about shoulder width apart and at the height of your head.  Place your elbows under your shoulders. If this is too painful, stack pillows under your chest.  Allow your body to relax so that your hips drop lower and make contact more completely with the floor.  Slowly return to lying flat on the floor.  RANGE OF MOTION - Extension, Prone Press Ups  Lie on your stomach on the floor, a bed will be too soft. Place your palms about shoulder width apart and at the height of your head.  Keeping your back as relaxed as possible, slowly straighten your elbows while keeping your hips on the floor. You may adjust the placement of your hands to maximize your comfort. As you gain motion, your hands will come more  underneath your shoulders.  Slowly return to lying flat on the floor.  RANGE OF MOTION- Quadruped, Neutral Spine   Assume a hands and knees position on a firm surface. Keep your hands under your shoulders and  your knees under your hips. You may place padding under your knees for comfort.  Drop your head and point your tail bone toward the ground below you. This will round out your lower back like an angry cat.    Slowly lift your head and release your tail bone so that your back sags into a large arch, like an old horse.  Repeat this until you feel limber in your lower back.  Now, find your "sweet spot." This will be the most comfortable position somewhere between the two previous positions. This is your neutral spine. Once you have found this position, tense your stomach muscles to support your lower back.  STRENGTHENING EXERCISES - Low Back Strain These exercises may help you when beginning to rehabilitate your injury. These exercises should be done near your "sweet spot." This is the neutral, low-back arch, somewhere between fully rounded and fully arched, that is your least painful position. When performed in this safe range of motion, these exercises can be used for people who have either a flexion or extension based injury. These exercises may resolve your symptoms with or without further involvement from your physician, physical therapist or athletic trainer. While completing these exercises, remember:   Muscles can gain both the endurance and the strength needed for everyday activities through controlled exercises.  Complete these exercises as instructed by your physician, physical therapist or athletic trainer. Increase the resistance and repetitions only as guided.  You may experience muscle soreness or fatigue, but the pain or discomfort you are trying to eliminate should never worsen during these exercises. If this pain does worsen, stop and make certain you are following the  directions exactly. If the pain is still present after adjustments, discontinue the exercise until you can discuss the trouble with your caregiver.  STRENGTHENING - Deep Abdominals, Pelvic Tilt  Lie on a firm bed or floor. Keeping your legs in front of you, bend your knees so they are both pointed toward the ceiling and your feet are flat on the floor.  Tense your lower abdominal muscles to press your lower back into the floor. This motion will rotate your pelvis so that your tail bone is scooping upwards rather than pointing at your feet or into the floor.  STRENGTHENING - Abdominals, Crunches   Lie on a firm bed or floor. Keeping your legs in front of you, bend your knees so they are both pointed toward the ceiling and your feet are flat on the floor. Cross your arms over your chest.  Slightly tip your chin down without bending your neck.  Tense your abdominals and slowly lift your trunk high enough to just clear your shoulder blades. Lifting higher can put excessive stress on the lower back and does not further strengthen your abdominal muscles.  Control your return to the starting position.  STRENGTHENING - Quadruped, Opposite UE/LE Lift   Assume a hands and knees position on a firm surface. Keep your hands under your shoulders and your knees under your hips. You may place padding under your knees for comfort.  Find your neutral spine and gently tense your abdominal muscles so that you can maintain this position. Your shoulders and hips should form a rectangle that is parallel with the floor and is not twisted.  Keeping your trunk steady, lift your right hand no higher than your shoulder and then your left leg no higher than your hip. Make sure you are not holding your breath.   Continuing to keep your  abdominal muscles tense and your back steady, slowly return to your starting position. Repeat with the opposite arm and leg.  STRENGTHENING - Lower Abdominals, Double Knee Lift  Lie  on a firm bed or floor. Keeping your legs in front of you, bend your knees so they are both pointed toward the ceiling and your feet are flat on the floor.  Tense your abdominal muscles to brace your lower back and slowly lift both of your knees until they come over your hips. Be certain not to hold your breath.  POSTURE AND BODY MECHANICS CONSIDERATIONS - Low Back Strain Keeping correct posture when sitting, standing or completing your activities will reduce the stress put on different body tissues, allowing injured tissues a chance to heal and limiting painful experiences. The following are general guidelines for improved posture. Your physician or physical therapist will provide you with any instructions specific to your needs. While reading these guidelines, remember:  The exercises prescribed by your provider will help you have the flexibility and strength to maintain correct postures.  The correct posture provides the best environment for your joints to work. All of your joints have less wear and tear when properly supported by a spine with good posture. This means you will experience a healthier, less painful body.  Correct posture must be practiced with all of your activities, especially prolonged sitting and standing. Correct posture is as important when doing repetitive low-stress activities (typing) as it is when doing a single heavy-load activity (lifting). RESTING POSITIONS Consider which positions are most painful for you when choosing a resting position. If you have pain with flexion-based activities (sitting, bending, stooping, squatting), choose a position that allows you to rest in a less flexed posture. You would want to avoid curling into a fetal position on your side. If your pain worsens with extension-based activities (prolonged standing, working overhead), avoid resting in an extended position such as sleeping on your stomach. Most people will find more comfort when they rest  with their spine in a more neutral position, neither too rounded nor too arched. Lying on a non-sagging bed on your side with a pillow between your knees, or on your back with a pillow under your knees will often provide some relief. Keep in mind, being in any one position for a prolonged period of time, no matter how correct your posture, can still lead to stiffness. PROPER SITTING POSTURE In order to minimize stress and discomfort on your spine, you must sit with correct posture. Sitting with good posture should be effortless for a healthy body. Returning to good posture is a gradual process. Many people can work toward this most comfortably by using various supports until they have the flexibility and strength to maintain this posture on their own. When sitting with proper posture, your ears will fall over your shoulders and your shoulders will fall over your hips. You should use the back of the chair to support your upper back. Your lower back will be in a neutral position, just slightly arched. You may place a small pillow or folded towel at the base of your lower back for support.  When working at a desk, create an environment that supports good, upright posture. Without extra support, muscles tire, which leads to excessive strain on joints and other tissues. Keep these recommendations in mind: CHAIR:  A chair should be able to slide under your desk when your back makes contact with the back of the chair. This allows you to work  closely.  The chair's height should allow your eyes to be level with the upper part of your monitor and your hands to be slightly lower than your elbows. BODY POSITION  Your feet should make contact with the floor. If this is not possible, use a foot rest.  Keep your ears over your shoulders. This will reduce stress on your neck and lower back. INCORRECT SITTING POSTURES  If you are feeling tired and unable to assume a healthy sitting posture, do not slouch or slump.  This puts excessive strain on your back tissues, causing more damage and pain. Healthier options include:  Using more support, like a lumbar pillow.  Switching tasks to something that requires you to be upright or walking.  Talking a brief walk.  Lying down to rest in a neutral-spine position. PROLONGED STANDING WHILE SLIGHTLY LEANING FORWARD  When completing a task that requires you to lean forward while standing in one place for a long time, place either foot up on a stationary 2-4 inch high object to help maintain the best posture. When both feet are on the ground, the lower back tends to lose its slight inward curve. If this curve flattens (or becomes too large), then the back and your other joints will experience too much stress, tire more quickly, and can cause pain. CORRECT STANDING POSTURES Proper standing posture should be assumed with all daily activities, even if they only take a few moments, like when brushing your teeth. As in sitting, your ears should fall over your shoulders and your shoulders should fall over your hips. You should keep a slight tension in your abdominal muscles to brace your spine. Your tailbone should point down to the ground, not behind your body, resulting in an over-extended swayback posture.  INCORRECT STANDING POSTURES  Common incorrect standing postures include a forward head, locked knees and/or an excessive swayback. WALKING Walk with an upright posture. Your ears, shoulders and hips should all line-up. PROLONGED ACTIVITY IN A FLEXED POSITION When completing a task that requires you to bend forward at your waist or lean over a low surface, try to find a way to stabilize 3 out of 4 of your limbs. You can place a hand or elbow on your thigh or rest a knee on the surface you are reaching across. This will provide you more stability so that your muscles do not fatigue as quickly. By keeping your knees relaxed, or slightly bent, you will also reduce stress  across your lower back. CORRECT LIFTING TECHNIQUES DO :   Assume a wide stance. This will provide you more stability and the opportunity to get as close as possible to the object which you are lifting.  Tense your abdominals to brace your spine. Bend at the knees and hips. Keeping your back locked in a neutral-spine position, lift using your leg muscles. Lift with your legs, keeping your back straight.  Test the weight of unknown objects before attempting to lift them.  Try to keep your elbows locked down at your sides in order get the best strength from your shoulders when carrying an object.  Always ask for help when lifting heavy or awkward objects. INCORRECT LIFTING TECHNIQUES DO NOT:   Lock your knees when lifting, even if it is a small object.  Bend and twist. Pivot at your feet or move your feet when needing to change directions.  Assume that you can safely pick up even a paper clip without proper posture.     IF  you received an x-ray today, you will receive an invoice from [facility]. Please contact [facility] at [phone] with questions or concerns regarding your invoice.   IF you received labwork today, you will receive an invoice from [facility]. Please contact [facility] at [phone] with questions or concerns regarding your invoice.   Our billing staff will not be able to assist you with questions regarding bills from these companies.  You will be contacted with the lab results as soon as they are available. The fastest way to get your results is to activate your My Chart account. Instructions are located on the last page of this paperwork. If you have not heard from [place] regarding the results in 2 weeks, please contact this office.

## 2018-01-20 LAB — CMP14+EGFR
A/G RATIO: 2 (ref 1.2–2.2)
ALBUMIN: 5 g/dL — AB (ref 3.6–4.8)
ALK PHOS: 50 IU/L (ref 39–117)
ALT: 17 IU/L (ref 0–32)
AST: 20 IU/L (ref 0–40)
BUN / CREAT RATIO: 15 (ref 12–28)
BUN: 11 mg/dL (ref 8–27)
Bilirubin Total: 0.5 mg/dL (ref 0.0–1.2)
CALCIUM: 9.9 mg/dL (ref 8.7–10.3)
CO2: 23 mmol/L (ref 20–29)
Chloride: 99 mmol/L (ref 96–106)
Creatinine, Ser: 0.74 mg/dL (ref 0.57–1.00)
GFR calc Af Amer: 102 mL/min/{1.73_m2} (ref >59)
GFR, EST NON AFRICAN AMERICAN: 88 mL/min/{1.73_m2} (ref >59)
GLOBULIN, TOTAL: 2.5 g/dL (ref 1.5–4.5)
Glucose: 103 mg/dL — ABNORMAL HIGH (ref 65–99)
POTASSIUM: 4.4 mmol/L (ref 3.5–5.2)
SODIUM: 138 mmol/L (ref 134–144)
Total Protein: 7.5 g/dL (ref 6.0–8.5)

## 2018-01-20 LAB — CBC WITH DIFFERENTIAL/PLATELET
BASOS: 1 %
Basophils Absolute: 0 10*3/uL (ref 0.0–0.2)
EOS (ABSOLUTE): 0.1 10*3/uL (ref 0.0–0.4)
EOS: 2 %
HEMATOCRIT: 37.1 % (ref 34.0–46.6)
HEMOGLOBIN: 12.2 g/dL (ref 11.1–15.9)
Immature Grans (Abs): 0 10*3/uL (ref 0.0–0.1)
Immature Granulocytes: 0 %
LYMPHS ABS: 1.6 10*3/uL (ref 0.7–3.1)
Lymphs: 32 %
MCH: 32.1 pg (ref 26.6–33.0)
MCHC: 32.9 g/dL (ref 31.5–35.7)
MCV: 98 fL — AB (ref 79–97)
MONOS ABS: 0.4 10*3/uL (ref 0.1–0.9)
Monocytes: 8 %
Neutrophils Absolute: 2.9 10*3/uL (ref 1.4–7.0)
Neutrophils: 57 %
Platelets: 287 10*3/uL (ref 150–450)
RBC: 3.8 x10E6/uL (ref 3.77–5.28)
RDW: 12.7 % (ref 12.3–15.4)
WBC: 5 10*3/uL (ref 3.4–10.8)

## 2018-01-20 LAB — LIPID PANEL
CHOL/HDL RATIO: 1.6 ratio (ref 0.0–4.4)
Cholesterol, Total: 231 mg/dL — ABNORMAL HIGH (ref 100–199)
HDL: 143 mg/dL (ref >39)
LDL CALC: 76 mg/dL (ref 0–99)
TRIGLYCERIDES: 60 mg/dL (ref 0–149)
VLDL Cholesterol Cal: 12 mg/dL (ref 5–40)

## 2018-01-20 LAB — TSH: TSH: 1.05 u[IU]/mL (ref 0.450–4.500)

## 2018-01-24 LAB — PAP IG AND HPV HIGH-RISK: HPV, high-risk: NEGATIVE

## 2019-02-14 ENCOUNTER — Telehealth: Payer: Self-pay | Admitting: General Practice

## 2019-02-14 NOTE — Telephone Encounter (Signed)
nortriptyline (PAMELOR) 75 MG capsule    Patient is requesting refill.    Pharmacy:  [id number] - [place], [place] - [address]. [address] [phone] (Phone) [phone] (Fax)

## 2019-02-15 NOTE — Telephone Encounter (Signed)
Pt called about status of refill/ advised Pt of refill time/ Pt stated she has two pills left

## 2019-02-16 ENCOUNTER — Other Ambulatory Visit: Payer: Self-pay

## 2019-02-16 ENCOUNTER — Ambulatory Visit: Payer: BLUE CROSS/BLUE SHIELD | Admitting: Adult Health Nurse Practitioner

## 2019-02-16 NOTE — Telephone Encounter (Signed)
Pt has called requesting urgent refill she runs out today, please advise

## 2020-02-08 IMAGING — MG MM DIGITAL SCREENING BILAT W/ TOMO W/ CAD
8 series · 9 of 24 positions shown · non-contrast
Comparison: Previous exam(s).

CLINICAL DATA: Screening.

EXAM:
DIGITAL SCREENING BILATERAL MAMMOGRAM WITH TOMO AND CAD

[R MLO synth-2D]
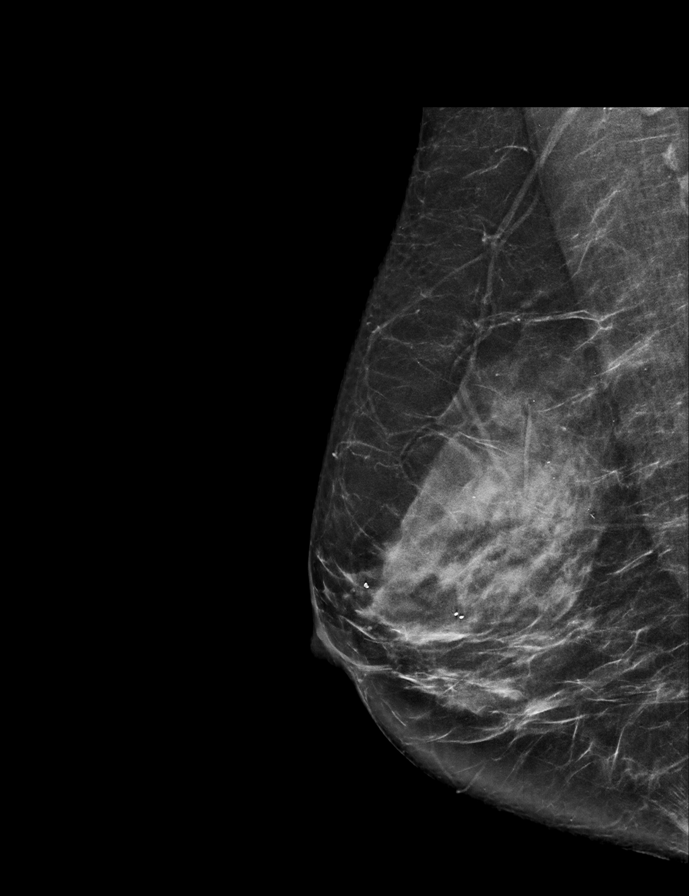

[L CC synth-2D]
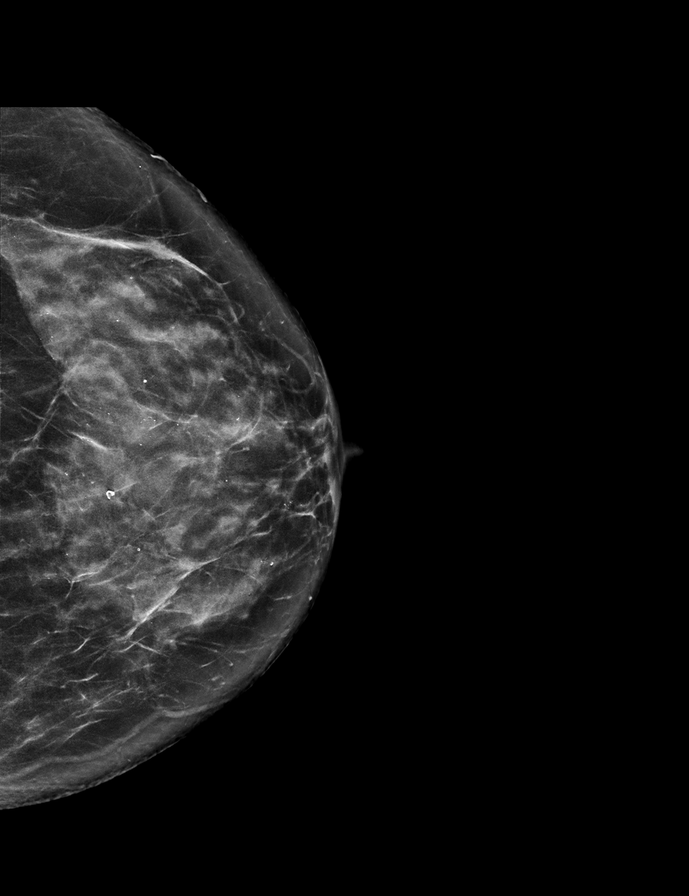

[R CC synth-2D]
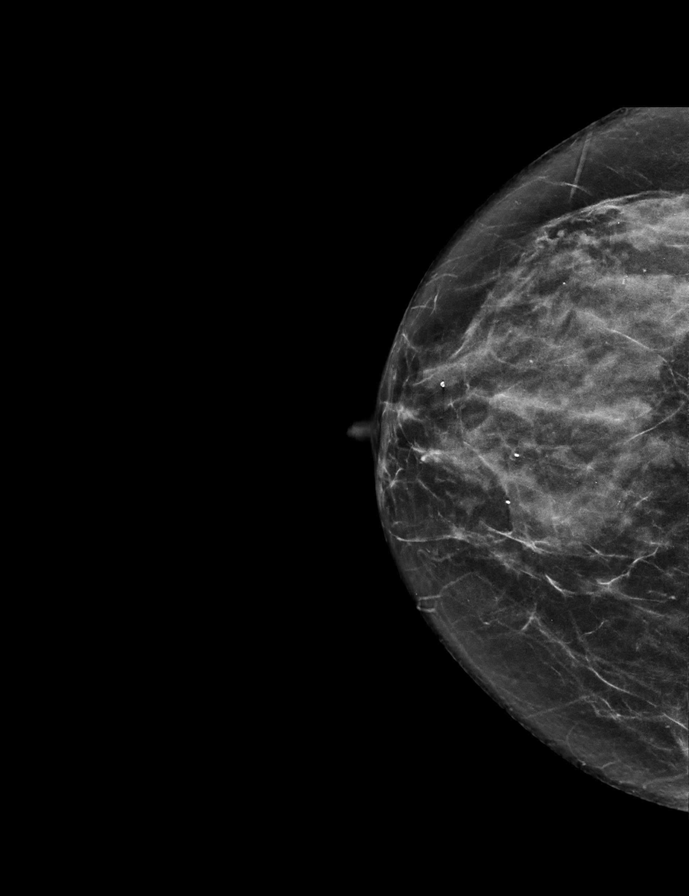

[L MLO synth-2D]
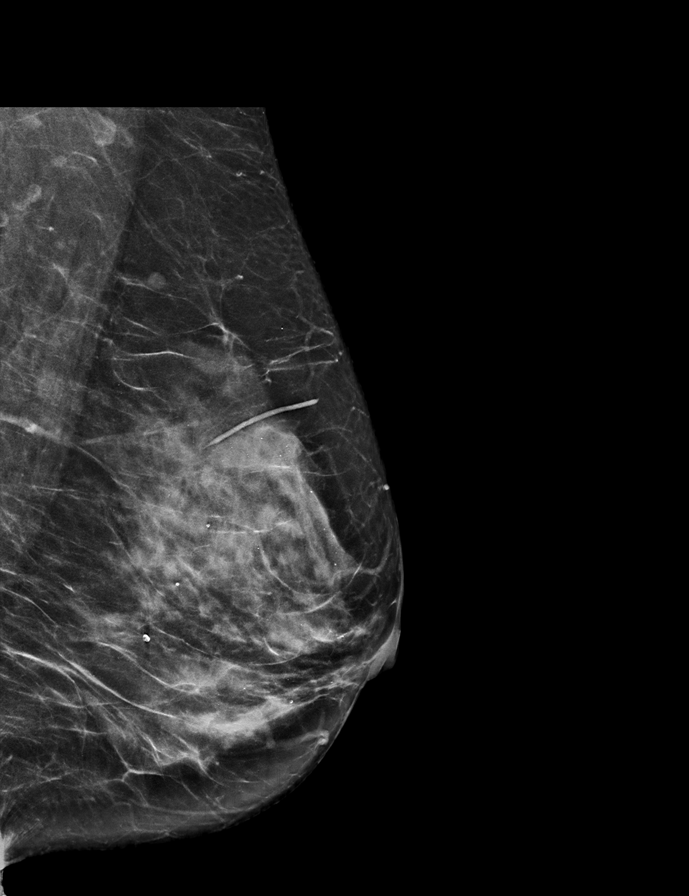

[R MLO tomo · 2 of 64 frames shown]
[frame 21/64]
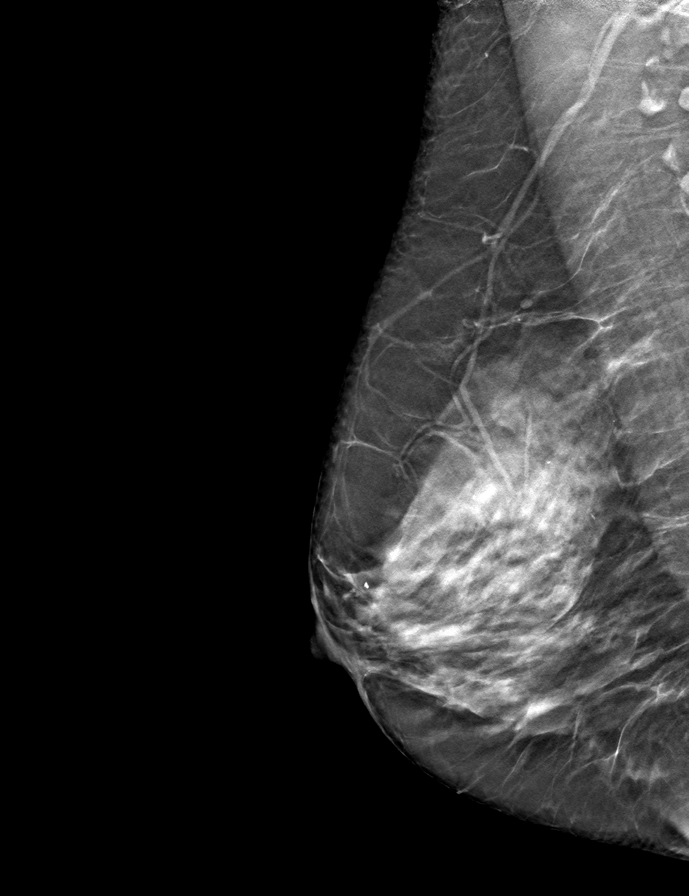
[frame 33/64]
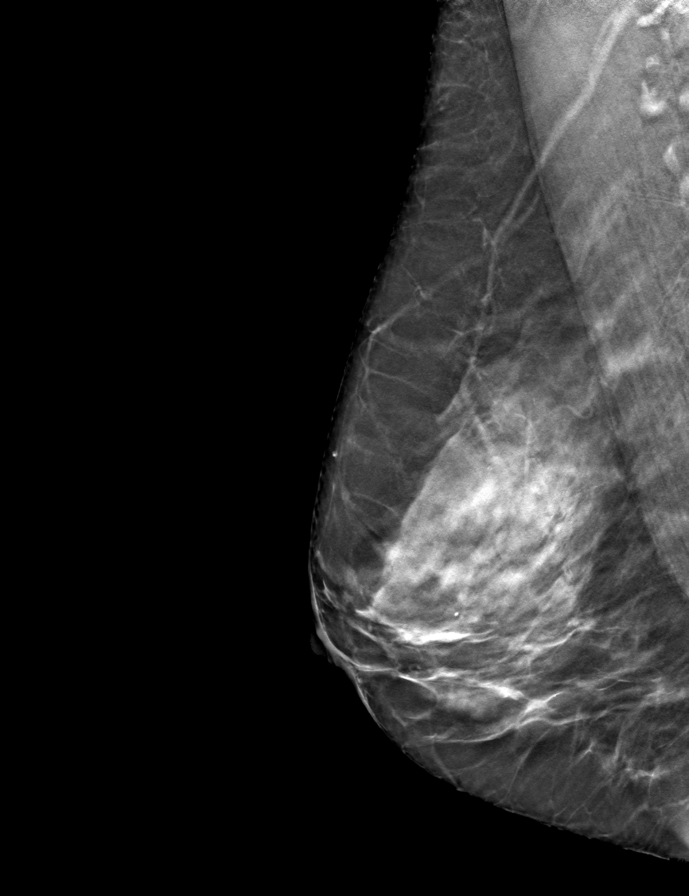

[L CC tomo · tomo slice 32/63.0]
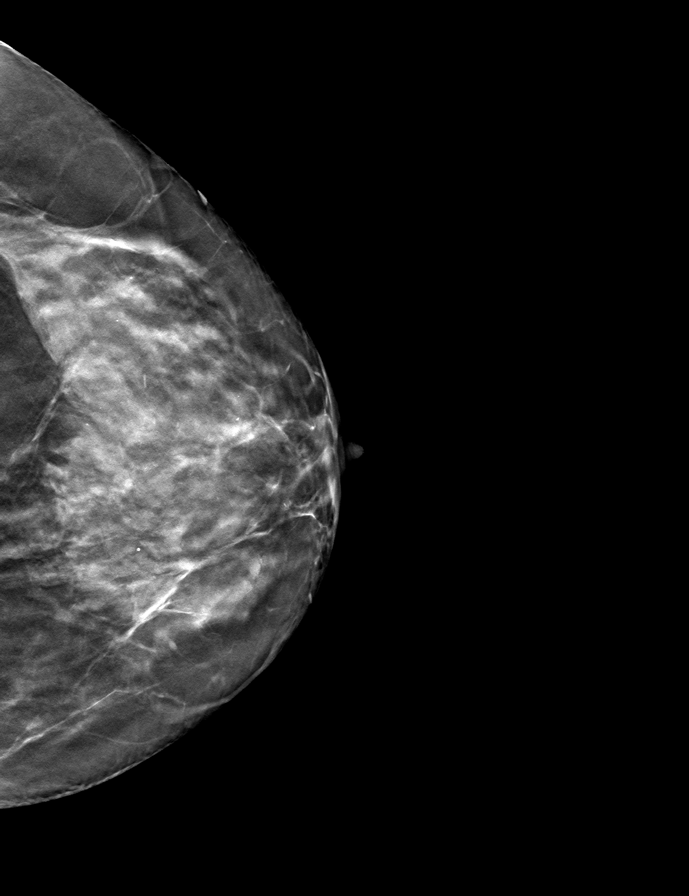

[R CC tomo · tomo slice 29/57.0]
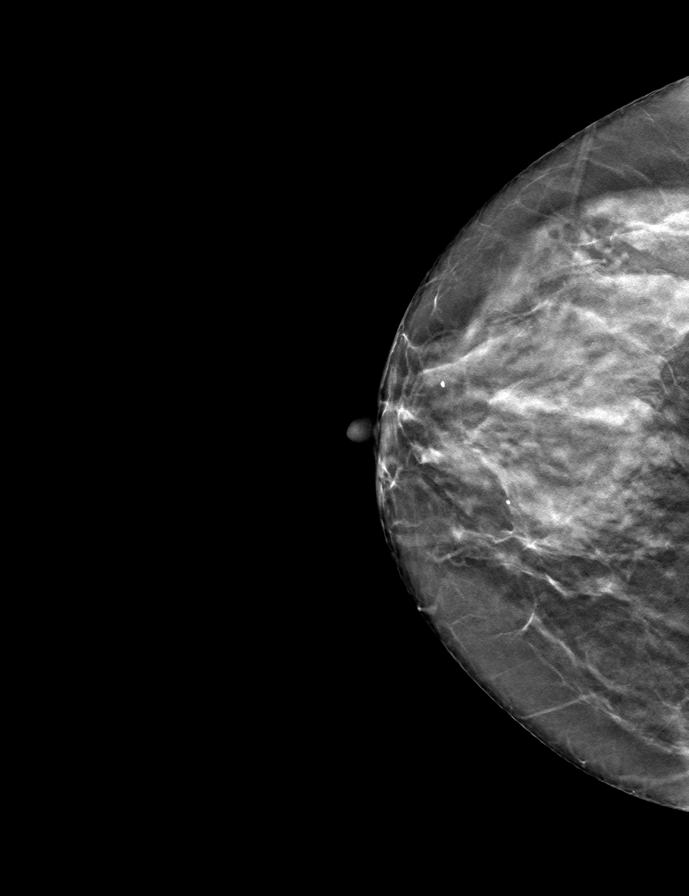

[L MLO tomo · tomo slice 33/65.0]
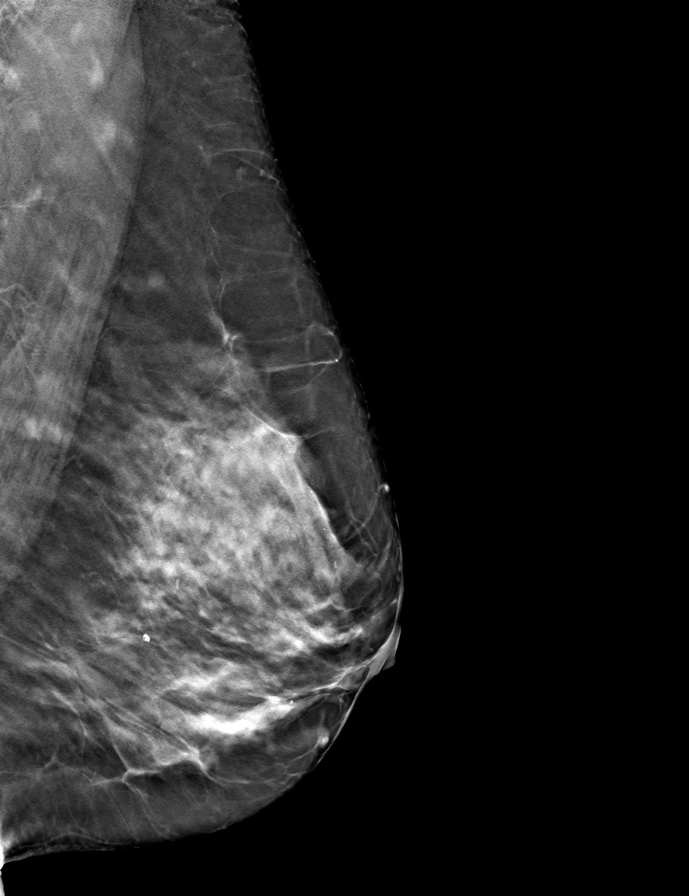

[9 of 24 positions shown; findings below may reference images not displayed]

ACR Breast Density Category d: The breast tissue is extremely dense,
which lowers the sensitivity of mammography.
FINDINGS: There are no findings suspicious for malignancy. Images were
processed with CAD.
IMPRESSION: No mammographic evidence of malignancy. A result letter of this
screening mammogram will be mailed directly to the patient.

RECOMMENDATION:
Screening mammogram in one year. (Code:RA-[id number])

BI-RADS CATEGORY  1: Negative.
